# Patient Record
Sex: Female | Born: 1950 | Race: Black or African American | Hispanic: No | Smoking: Former smoker
Health system: Southern US, Community
[De-identification: ages and names within clinical notes are randomized; demographics above are authoritative.]

## PROBLEM LIST (undated history)

## (undated) DIAGNOSIS — E785 Hyperlipidemia, unspecified: Secondary | ICD-10-CM

## (undated) DIAGNOSIS — I10 Essential (primary) hypertension: Secondary | ICD-10-CM

## (undated) DIAGNOSIS — IMO0002 Reserved for concepts with insufficient information to code with codable children: Secondary | ICD-10-CM

## (undated) DIAGNOSIS — R0789 Other chest pain: Secondary | ICD-10-CM

## (undated) DIAGNOSIS — R9430 Abnormal result of cardiovascular function study, unspecified: Secondary | ICD-10-CM

## (undated) HISTORY — DX: Hyperlipidemia, unspecified: E78.5

## (undated) HISTORY — DX: Abnormal result of cardiovascular function study, unspecified: R94.30

## (undated) HISTORY — DX: Reserved for concepts with insufficient information to code with codable children: IMO0002

## (undated) HISTORY — DX: Other chest pain: R07.89

---

## 2002-01-03 ENCOUNTER — Encounter: Payer: Self-pay | Admitting: Obstetrics and Gynecology

## 2002-01-03 ENCOUNTER — Other Ambulatory Visit: Admission: RE | Admit: 2002-01-03 | Discharge: 2002-01-03 | Payer: Self-pay | Admitting: Obstetrics and Gynecology

## 2002-01-03 ENCOUNTER — Ambulatory Visit (HOSPITAL_COMMUNITY): Admission: RE | Admit: 2002-01-03 | Discharge: 2002-01-03 | Payer: Self-pay | Admitting: Obstetrics and Gynecology

## 2003-05-30 ENCOUNTER — Other Ambulatory Visit: Admission: RE | Admit: 2003-05-30 | Discharge: 2003-05-30 | Payer: Self-pay | Admitting: Obstetrics and Gynecology

## 2003-06-02 ENCOUNTER — Ambulatory Visit (HOSPITAL_COMMUNITY): Admission: RE | Admit: 2003-06-02 | Discharge: 2003-06-02 | Payer: Self-pay | Admitting: Obstetrics and Gynecology

## 2005-07-04 ENCOUNTER — Encounter: Admission: RE | Admit: 2005-07-04 | Discharge: 2005-07-04 | Payer: Self-pay | Admitting: Family Medicine

## 2012-05-05 ENCOUNTER — Other Ambulatory Visit: Payer: Self-pay

## 2012-05-05 DIAGNOSIS — Z1231 Encounter for screening mammogram for malignant neoplasm of breast: Secondary | ICD-10-CM

## 2012-06-01 ENCOUNTER — Ambulatory Visit
Admission: RE | Admit: 2012-06-01 | Discharge: 2012-06-01 | Disposition: A | Discharge status: Home / Self Care | Payer: 59 | Source: Ambulatory Visit

## 2012-06-01 DIAGNOSIS — Z1231 Encounter for screening mammogram for malignant neoplasm of breast: Secondary | ICD-10-CM

## 2012-12-03 ENCOUNTER — Emergency Department (HOSPITAL_COMMUNITY)
Admission: EM | Admit: 2012-12-03 | Discharge: 2012-12-03 | Disposition: A | Discharge status: Home / Self Care | Payer: 59 | Source: Home / Self Care | Attending: Family Medicine | Admitting: Family Medicine

## 2012-12-03 ENCOUNTER — Encounter (HOSPITAL_COMMUNITY): Payer: Self-pay | Admitting: Emergency Medicine

## 2012-12-03 DIAGNOSIS — M62830 Muscle spasm of back: Secondary | ICD-10-CM

## 2012-12-03 DIAGNOSIS — M549 Dorsalgia, unspecified: Secondary | ICD-10-CM

## 2012-12-03 DIAGNOSIS — M5380 Other specified dorsopathies, site unspecified: Secondary | ICD-10-CM

## 2012-12-03 HISTORY — DX: Essential (primary) hypertension: I10

## 2012-12-03 LAB — POCT URINALYSIS DIP (DEVICE)
Bilirubin Urine: NEGATIVE
Glucose, UA: NEGATIVE mg/dL
Hgb urine dipstick: NEGATIVE
Ketones, ur: NEGATIVE mg/dL
Leukocytes, UA: NEGATIVE
Nitrite: NEGATIVE
Protein, ur: NEGATIVE mg/dL
Specific Gravity, Urine: 1.015 (ref 1.005–1.030)
Urobilinogen, UA: 0.2 mg/dL (ref 0.0–1.0)
pH: 7 (ref 5.0–8.0)

## 2012-12-03 MED ORDER — ETODOLAC 500 MG PO TABS: 500.0000 mg | ORAL_TABLET | Freq: Two times a day (BID) | ORAL | Status: DC

## 2012-12-03 MED ORDER — TRAMADOL HCL 50 MG PO TABS: 50.0000 mg | ORAL_TABLET | Freq: Four times a day (QID) | ORAL | Status: DC | PRN

## 2012-12-03 MED ORDER — CYCLOBENZAPRINE HCL 10 MG PO TABS: 10.0000 mg | ORAL_TABLET | Freq: Two times a day (BID) | ORAL | Status: DC | PRN

## 2012-12-03 NOTE — ED Notes (Signed)
C/o lower back pain. Onset Tuesday evening after heavy lifting. States felt a little ache took otc pain meds with mild relief. Wednesday gradually started getting worse. Thursday and today experiencing severe pain with standing,sitting and walking.   Using heating  Pad and otc meds with no relief in symptoms

## 2012-12-03 NOTE — ED Provider Notes (Signed)
Physical exam is normal, no tenderness this morning there is CSN: [id number]     Arrival date & time 12/03/12  4098 History   First MD Initiated Contact with Patient 12/03/12 1015     Chief Complaint  Patient presents with  . Back Pain    onset tuesday evening after heavy lifting.    (Consider location/radiation/quality/duration/timing/severity/associated sxs/prior Treatment) HPI Comments: 62 year old female presents complaining of lower back and right hip pain. This began on Wednesday. She notes that Tuesday, she lifted a lot of heavy items. She started to feel some slight pain on Tuesday, but she started dealing with severe stabbing pain started on Wednesday. She has intermittent sharp stabs of pain that are incredibly severe. This is caused by any movement and is somewhat relieved by sitting still. The pain has been getting gradually worse. She's been taking one Advil approximately every 4 hours as needed for pain. It is not helping at all. She denies any loss of bowel or bladder control, extremity weakness, extremity numbness, perineal numbness. She has experienced something like this before in the past, many years ago.  Patient is a 62 y.o. female presenting with back pain.  Back Pain Associated symptoms: no abdominal pain, no chest pain, no dysuria, no fever and no weakness     Past Medical History  Diagnosis Date  . Hypertension   . High cholesterol    History reviewed. No pertinent past surgical history. History reviewed. No pertinent family history. History  Substance Use Topics  . Smoking status: Never Smoker   . Smokeless tobacco: Not on file  . Alcohol Use: No   OB History   Grav Para Term Preterm Abortions TAB SAB Ect Mult Living                 Review of Systems  Constitutional: Negative for fever and chills.  Eyes: Negative for visual disturbance.  Respiratory: Negative for cough and shortness of breath.   Cardiovascular: Negative for chest pain, palpitations and  leg swelling.  Gastrointestinal: Negative for nausea, vomiting and abdominal pain.  Endocrine: Negative for polydipsia and polyuria.  Genitourinary: Negative for dysuria, urgency and frequency.  Musculoskeletal: Positive for arthralgias and back pain. Negative for myalgias.  Skin: Negative for rash.  Neurological: Negative for dizziness, weakness and light-headedness.    Allergies  Review of patient's allergies indicates no known allergies.  Home Medications   Current Outpatient Rx  Name  Route  Sig  Dispense  Refill  . Ergocalciferol (VITAMIN D2 PO)   Oral   Take by mouth.         . rosuvastatin (CRESTOR) 5 MG tablet   Oral   Take 5 mg by mouth daily.         . valsartan-hydrochlorothiazide (DIOVAN-HCT) 160-25 MG per tablet   Oral   Take 1 tablet by mouth daily.         . cyclobenzaprine (FLEXERIL) 10 MG tablet   Oral   Take 1 tablet (10 mg total) by mouth 2 (two) times daily as needed for muscle spasms.   30 tablet   0   . etodolac (LODINE) 500 MG tablet   Oral   Take 1 tablet (500 mg total) by mouth 2 (two) times daily.   30 tablet   0   . traMADol (ULTRAM) 50 MG tablet   Oral   Take 1 tablet (50 mg total) by mouth every 6 (six) hours as needed for pain.   20 tablet  0    BP 152/53  Pulse 62  Temp(Src) 98 F (36.7 C) (Oral)  Resp 12  SpO2 100% Physical Exam  Nursing note and vitals reviewed. Constitutional: She is oriented to person, place, and time. Vital signs are normal. She appears well-developed and well-nourished. No distress.  HENT:  Head: Normocephalic and atraumatic.  Pulmonary/Chest: Effort normal. No respiratory distress.  Musculoskeletal:       Right hip: Normal.       Lumbar back: She exhibits pain.  Neurological: She is alert and oriented to person, place, and time. She has normal strength. No sensory deficit. Coordination normal.  Skin: Skin is warm and dry. No rash noted. She is not diaphoretic.  Psychiatric: She has a normal  mood and affect. Judgment normal.    ED Course  Procedures (including critical care time) Labs Review Labs Reviewed  POCT URINALYSIS DIP (DEVICE)   Imaging Review No results found.    MDM   1. Back muscle spasm   2. Back pain     Treating with NSAIDs, muscle relaxers, tramadol. Advised patient to expect this to be near resolution after a total of about one week. It does not occur, she will followup with her primary care for further evaluation.   Meds ordered this encounter  Medications  . etodolac (LODINE) 500 MG tablet    Sig: Take 1 tablet (500 mg total) by mouth 2 (two) times daily.    Dispense:  30 tablet    Refill:  0    Order Specific Question:  Supervising Provider    Answer:  Clementeen Graham, S [id number]  . cyclobenzaprine (FLEXERIL) 10 MG tablet    Sig: Take 1 tablet (10 mg total) by mouth 2 (two) times daily as needed for muscle spasms.    Dispense:  30 tablet    Refill:  0    Order Specific Question:  Supervising Provider    Answer:  Clementeen Graham, S [id number]  . traMADol (ULTRAM) 50 MG tablet    Sig: Take 1 tablet (50 mg total) by mouth every 6 (six) hours as needed for pain.    Dispense:  20 tablet    Refill:  0    Order Specific Question:  Supervising Provider    Answer:  Clementeen Graham, Kathie Rhodes [[id number]]       Graylon Good, PA-C 12/03/12 1035

## 2012-12-04 NOTE — ED Provider Notes (Signed)
Medical screening examination/treatment/procedure(s) were performed by a resident physician or non-physician practitioner and as the supervising physician I was immediately available for consultation/collaboration.  Jafari Mckillop, MD   Nakema Fake S Angie Hogg, MD 12/04/12 0839 

## 2013-05-09 ENCOUNTER — Ambulatory Visit (INDEPENDENT_AMBULATORY_CARE_PROVIDER_SITE_OTHER): Payer: 59 | Admitting: Cardiology

## 2013-05-09 ENCOUNTER — Encounter: Payer: Self-pay | Admitting: Cardiology

## 2013-05-09 VITALS — BP 160/86 | HR 61 | Ht 62.5 in | Wt 131.1 lb

## 2013-05-09 DIAGNOSIS — R9431 Abnormal electrocardiogram [ECG] [EKG]: Secondary | ICD-10-CM | POA: Insufficient documentation

## 2013-05-09 DIAGNOSIS — E785 Hyperlipidemia, unspecified: Secondary | ICD-10-CM | POA: Insufficient documentation

## 2013-05-09 DIAGNOSIS — I10 Essential (primary) hypertension: Secondary | ICD-10-CM

## 2013-05-09 DIAGNOSIS — R0789 Other chest pain: Secondary | ICD-10-CM | POA: Insufficient documentation

## 2013-05-09 NOTE — Patient Instructions (Signed)
Your physician has requested that you have an echocardiogram. Echocardiography is a painless test that uses sound waves to create images of your heart. It provides your doctor with information about the size and shape of your heart and how well your heart's chambers and valves are working. This procedure takes approximately one hour. There are no restrictions for this procedure.  Your physician recommends that you schedule a follow-up appointment in: we will contact you with your Echo results.

## 2013-05-09 NOTE — Assessment & Plan Note (Signed)
The EKG changes are nonspecific. She will have a followup 2-D echo to be sure that there are no wall motion abnormalities.

## 2013-05-09 NOTE — Assessment & Plan Note (Signed)
Blood pressure is mildly elevated today. It was not elevated at Dr. Orest Dikes office. I feel it is prudent not to change her medicine at this time.

## 2013-05-09 NOTE — Assessment & Plan Note (Signed)
At this point the patient is feeling better. We do not have any definite proof of an ischemic etiology. With risk factors and with her resting nonspecific EKG abnormalities, I feel we should proceed with 2-D echo. I will review this study when it is available. We will then be in touch with the patient. If the echo is normal, she would not need any further workup and she would not need a followup visit. We will let her know of the plans after we have the echo data.

## 2013-05-09 NOTE — Progress Notes (Signed)
Patient ID: Veronica Diaz, female   DOB: 1950-05-18, 63 y.o.   MRN: [id number]    HPI  Patient is seen today for cardiology consultation to assess chest tightness. I've received excellent records from Dr. Harlan Stains. The patient does not have documented coronary disease. She does have a history of hypertension and dyslipidemia. Recently she had some chest tightness. There was some component of discomfort with cough and some discomfort with movement. However, there were other components that were concerning. The patient does have risk factors for coronary disease. Her EKG is mildly abnormal. She was referred for further evaluation.  Since she was assessed by Dr. Dema Severin, she is feeling better. She's not having any exertional chest tightness. There is a history of hypertension and dyslipidemia. There is coronary disease in the family but not at a young age. She has returned to normal activities.   Allergies  Allergen Reactions  . Pravastatin Other (See Comments)    Leg Cramps     Current Outpatient Prescriptions  Medication Sig Dispense Refill  . cyclobenzaprine (FLEXERIL) 10 MG tablet Take 1 tablet (10 mg total) by mouth 2 (two) times daily as needed for muscle spasms.  30 tablet  0  . Ergocalciferol (VITAMIN D2 PO) Take by mouth.      . Esomeprazole Magnesium (NEXIUM PO) Take by mouth daily.      Marland Kitchen etodolac (LODINE) 500 MG tablet Take 1 tablet (500 mg total) by mouth 2 (two) times daily.  30 tablet  0  . LUMIGAN 0.01 % SOLN Place 1 drop into both eyes at bedtime.       . rosuvastatin (CRESTOR) 5 MG tablet Take 5 mg by mouth daily.      . traMADol (ULTRAM) 50 MG tablet Take 1 tablet (50 mg total) by mouth every 6 (six) hours as needed for pain.  20 tablet  0  . valsartan-hydrochlorothiazide (DIOVAN-HCT) 160-25 MG per tablet Take 1 tablet by mouth daily.       No current facility-administered medications for this visit.    History   Social History  . Marital Status: Legally  Separated    Spouse Name: N/A    Number of Children: N/A  . Years of Education: N/A   Occupational History  . Not on file.   Social History Main Topics  . Smoking status: Former Research scientist (life sciences)  . Smokeless tobacco: Not on file  . Alcohol Use: No  . Drug Use: No  . Sexual Activity: Yes   Other Topics Concern  . Not on file   Social History Narrative  . No narrative on file    Family History  Problem Relation Age of Onset  . Heart disease Father     Past Medical History  Diagnosis Date  . Hypertension   . Dyslipidemia   . Chest tightness     History reviewed. No pertinent past surgical history.  Patient Active Problem List   Diagnosis Date Noted  . Abnormal EKG 05/09/2013  . Hypertension   . Dyslipidemia   . Chest tightness     ROS   Patient denies fever, chills, headache, sweats, rash, change in vision, change in hearing, chest pain, cough, nausea vomiting, urinary symptoms. All other systems are reviewed and are negative.    PHYSICAL EXAM  patient is oriented to person time and place. Affect is normal. There is no jugulovenous distention. Lungs are clear. Respiratory effort is nonlabored. Cardiac exam reveals S1 and S2. There no significant murmurs. The abdomen  is soft. There is no peripheral edema. There no musculoskeletal deformities. There are no skin rashes.  Filed Vitals:   05/09/13 1114  BP: 160/86  Pulse: 61  Height: 5' 2.5" (1.588 m)  Weight: 131 lb 1.9 oz (59.476 kg)    EKG is done today and reviewed by me. There is sinus rhythm. There are diffuse nonspecific ST-T wave changes.   ASSESSMENT & PLAN

## 2013-05-25 ENCOUNTER — Ambulatory Visit (HOSPITAL_COMMUNITY): Payer: 59 | Attending: Cardiology | Admitting: Radiology

## 2013-05-25 DIAGNOSIS — R9431 Abnormal electrocardiogram [ECG] [EKG]: Secondary | ICD-10-CM

## 2013-05-25 NOTE — Progress Notes (Signed)
Echocardiogram performed.  

## 2013-05-27 ENCOUNTER — Encounter: Payer: Self-pay | Admitting: Cardiology

## 2013-05-27 DIAGNOSIS — R9430 Abnormal result of cardiovascular function study, unspecified: Secondary | ICD-10-CM | POA: Insufficient documentation

## 2013-05-27 NOTE — Progress Notes (Signed)
Patient ID: Veronica Diaz, female   DOB: 1950-06-20, 63 y.o.   MRN: [id number] I saw the patient in consultation in the office in March, 2015. The plan was to proceed with two-dimensional echo. The study has been done and the echo was normal. With this information, the plan is for no further cardiac workup. The patient will be notified of the good result later today.  Daryel November, MD

## 2014-01-19 ENCOUNTER — Other Ambulatory Visit: Payer: Self-pay

## 2014-01-19 DIAGNOSIS — Z1231 Encounter for screening mammogram for malignant neoplasm of breast: Secondary | ICD-10-CM

## 2014-02-01 ENCOUNTER — Ambulatory Visit
Admission: RE | Admit: 2014-02-01 | Discharge: 2014-02-01 | Disposition: A | Discharge status: Home / Self Care | Payer: 59 | Source: Ambulatory Visit

## 2014-02-01 DIAGNOSIS — Z1231 Encounter for screening mammogram for malignant neoplasm of breast: Secondary | ICD-10-CM

## 2014-02-16 ENCOUNTER — Other Ambulatory Visit (HOSPITAL_COMMUNITY)
Admission: RE | Admit: 2014-02-16 | Discharge: 2014-02-16 | Disposition: A | Discharge status: Home / Self Care | Payer: 59 | Source: Ambulatory Visit | Attending: Obstetrics & Gynecology | Admitting: Obstetrics & Gynecology

## 2014-02-16 ENCOUNTER — Other Ambulatory Visit: Payer: Self-pay | Admitting: Obstetrics & Gynecology

## 2014-02-16 DIAGNOSIS — Z1151 Encounter for screening for human papillomavirus (HPV): Secondary | ICD-10-CM | POA: Insufficient documentation

## 2014-02-16 DIAGNOSIS — Z01419 Encounter for gynecological examination (general) (routine) without abnormal findings: Secondary | ICD-10-CM | POA: Insufficient documentation

## 2014-02-20 LAB — CYTOLOGY - PAP

## 2015-11-11 ENCOUNTER — Emergency Department (HOSPITAL_COMMUNITY): Payer: 59

## 2015-11-11 ENCOUNTER — Emergency Department (HOSPITAL_COMMUNITY)
Admission: EM | Admit: 2015-11-11 | Discharge: 2015-11-11 | Disposition: A | Discharge status: Home / Self Care | Payer: 59 | Attending: Emergency Medicine | Admitting: Emergency Medicine

## 2015-11-11 ENCOUNTER — Encounter (HOSPITAL_COMMUNITY): Payer: Self-pay | Admitting: Emergency Medicine

## 2015-11-11 DIAGNOSIS — Y929 Unspecified place or not applicable: Secondary | ICD-10-CM | POA: Diagnosis not present

## 2015-11-11 DIAGNOSIS — I10 Essential (primary) hypertension: Secondary | ICD-10-CM | POA: Insufficient documentation

## 2015-11-11 DIAGNOSIS — Z23 Encounter for immunization: Secondary | ICD-10-CM | POA: Insufficient documentation

## 2015-11-11 DIAGNOSIS — Y999 Unspecified external cause status: Secondary | ICD-10-CM | POA: Insufficient documentation

## 2015-11-11 DIAGNOSIS — S91115A Laceration without foreign body of left lesser toe(s) without damage to nail, initial encounter: Secondary | ICD-10-CM | POA: Diagnosis not present

## 2015-11-11 DIAGNOSIS — Y9389 Activity, other specified: Secondary | ICD-10-CM | POA: Diagnosis not present

## 2015-11-11 DIAGNOSIS — Z87891 Personal history of nicotine dependence: Secondary | ICD-10-CM | POA: Insufficient documentation

## 2015-11-11 DIAGNOSIS — S91119A Laceration without foreign body of unspecified toe without damage to nail, initial encounter: Secondary | ICD-10-CM

## 2015-11-11 DIAGNOSIS — W208XXA Other cause of strike by thrown, projected or falling object, initial encounter: Secondary | ICD-10-CM | POA: Diagnosis not present

## 2015-11-11 MED ORDER — LIDOCAINE HCL (PF) 1 % IJ SOLN
10.0000 mL | Freq: Once | INTRAMUSCULAR | Status: AC
  Administered 2015-11-11: 10 mL
  Filled 2015-11-11: qty 10

## 2015-11-11 MED ORDER — ACETAMINOPHEN 500 MG PO TABS: 500.0000 mg | ORAL_TABLET | Freq: Four times a day (QID) | ORAL | 0 refills | Status: DC | PRN

## 2015-11-11 MED ORDER — LIDOCAINE-EPINEPHRINE-TETRACAINE (LET) SOLUTION
3.0000 mL | Freq: Once | NASAL | Status: AC
  Administered 2015-11-11: 3 mL via TOPICAL
  Filled 2015-11-11: qty 3

## 2015-11-11 MED ORDER — ACETAMINOPHEN 325 MG PO TABS
650.0000 mg | ORAL_TABLET | Freq: Once | ORAL | Status: AC
  Administered 2015-11-11: 650 mg via ORAL
  Filled 2015-11-11: qty 2

## 2015-11-11 MED ORDER — BACITRACIN ZINC 500 UNIT/GM EX OINT
1.0000 "application " | TOPICAL_OINTMENT | Freq: Two times a day (BID) | CUTANEOUS | Status: DC
  Administered 2015-11-11: 1 via TOPICAL

## 2015-11-11 MED ORDER — TETANUS-DIPHTH-ACELL PERTUSSIS 5-2.5-18.5 LF-MCG/0.5 IM SUSP
0.5000 mL | Freq: Once | INTRAMUSCULAR | Status: AC
  Administered 2015-11-11: 0.5 mL via INTRAMUSCULAR
  Filled 2015-11-11: qty 0.5

## 2015-11-11 MED ORDER — BACITRACIN ZINC 500 UNIT/GM EX OINT: 1.0000 "application " | TOPICAL_OINTMENT | Freq: Two times a day (BID) | CUTANEOUS | 1 refills | Status: DC

## 2015-11-11 NOTE — ED Triage Notes (Signed)
Pt presents to ED for assessment of a laceration to the third toe of the left foot.  Pt sts she dropped a hot glass baking dish that shattered and cut her toe.  Bleeding controlled.

## 2015-11-11 NOTE — ED Provider Notes (Signed)
[facility] DEPT Provider Note   CSN: [id number] Arrival date & time: 11/11/15  1857  By signing my name below, I, Soijett Blue, attest that this documentation has been prepared under the direction and in the presence of Will Eliab Closson, PA-C Electronically Signed: Soijett Blue, ED Scribe. 11/11/15. 9:11 PM.   History   Chief Complaint Chief Complaint  Patient presents with  . Extremity Laceration    HPI Veronica Diaz is a 65 y.o. female with a PMHx of HTN, who presents to the Emergency Department complaining of an extremity laceration onset 3 hours ago. Pt reports that she placed a glass baking dish on a hot stove burner and when she went to remove it, the dish shattered and cut her left third toe. Pt notes that she is unsure of the status of when her last tetanus vaccination was. Pt is having associated symptoms of left third toe pain. She states that she has tried applying a bandage without any medications for the relief of her symptoms. She denies color change, numbness, weakness, swelling, and any other symptoms.     The history is provided by the patient. No language interpreter was used.    Past Medical History:  Diagnosis Date  . Chest tightness   . Dyslipidemia   . Ejection fraction   . Hypertension     Patient Active Problem List   Diagnosis Date Noted  . Ejection fraction   . Abnormal EKG 05/09/2013  . Hypertension   . Dyslipidemia   . Chest tightness     History reviewed. No pertinent surgical history.  OB History    No data available       Home Medications    Prior to Admission medications   Medication Sig Start Date End Date Taking? Authorizing Provider  acetaminophen (TYLENOL) 500 MG tablet Take 1 tablet (500 mg total) by mouth every 6 (six) hours as needed for mild pain or moderate pain. 11/11/15   Waynetta Pean, PA-C  bacitracin ointment Apply 1 application topically 2 (two) times daily. 11/11/15   Waynetta Pean, PA-C  cyclobenzaprine  (FLEXERIL) 10 MG tablet Take 1 tablet (10 mg total) by mouth 2 (two) times daily as needed for muscle spasms. 12/03/12   Liam Graham, PA-C  Ergocalciferol (VITAMIN D2 PO) Take by mouth.    Historical Provider, MD  Esomeprazole Magnesium (NEXIUM PO) Take by mouth daily.    Historical Provider, MD  etodolac (LODINE) 500 MG tablet Take 1 tablet (500 mg total) by mouth 2 (two) times daily. 12/03/12   Freeman Caldron Baker, PA-C  LUMIGAN 0.01 % SOLN Place 1 drop into both eyes at bedtime.  04/18/13   Historical Provider, MD  rosuvastatin (CRESTOR) 5 MG tablet Take 5 mg by mouth daily.    Historical Provider, MD  traMADol (ULTRAM) 50 MG tablet Take 1 tablet (50 mg total) by mouth every 6 (six) hours as needed for pain. 12/03/12   Freeman Caldron Baker, PA-C  valsartan-hydrochlorothiazide (DIOVAN-HCT) 160-25 MG per tablet Take 1 tablet by mouth daily.    Historical Provider, MD    Family History Family History  Problem Relation Age of Onset  . Heart disease Father     Social History Social History  Substance Use Topics  . Smoking status: Former Research scientist (life sciences)  . Smokeless tobacco: Never Used  . Alcohol use No     Allergies   Pravastatin   Review of Systems Review of Systems  Constitutional: Negative for fever.  Musculoskeletal: Positive for arthralgias (  left third toe). Negative for joint swelling.  Skin: Positive for wound (laceration to left third toe). Negative for color change.  Neurological: Negative for weakness and numbness.     Physical Exam Updated Vital Signs BP 171/64   Pulse 67   Temp 98.1 F (36.7 C) (Oral)   Resp 18   SpO2 97%   Physical Exam  Constitutional: She appears well-developed and well-nourished. No distress.  HENT:  Head: Normocephalic and atraumatic.  Eyes: Right eye exhibits no discharge. Left eye exhibits no discharge.  Cardiovascular: Normal rate, regular rhythm and intact distal pulses.   Bilateral dorsalis pedis and posterior tibialis pulses intact.     Pulmonary/Chest: Effort normal. No respiratory distress.  Musculoskeletal: Normal range of motion. She exhibits no deformity.  Neurological: She is alert. Coordination normal.  Skin: Skin is warm and dry. Capillary refill takes less than 2 seconds. Laceration noted. No rash noted. She is not diaphoretic. No erythema. No pallor.  Laceration overlying the left third toe, 3 cm in length. Bleeding controlled.   Psychiatric: She has a normal mood and affect. Her behavior is normal.  Nursing note and vitals reviewed.    ED Treatments / Results  DIAGNOSTIC STUDIES: Oxygen Saturation is 100% on RA, nl by my interpretation.    COORDINATION OF CARE: 9:07 PM Discussed treatment plan with pt at bedside which includes update tetanus vaccination, left foot xray, laceration repair, and pt agreed to plan.   Radiology Dg Foot Complete Left  Result Date: 11/11/2015 CLINICAL DATA:  Acute onset of laceration to the dorsal left third toe from dropped glass pot. Initial encounter. EXAM: LEFT FOOT - COMPLETE 3+ VIEW COMPARISON:  None. FINDINGS: There is no evidence of fracture or dislocation. The joint spaces are preserved. There is no evidence of talar subluxation; the subtalar joint is unremarkable in appearance. An os peroneum is noted. A small plantar calcaneal spur is noted. The known soft tissue laceration is not well characterized on radiograph. No radiopaque foreign bodies are seen. IMPRESSION: 1. No evidence of fracture or dislocation. 2. No radiopaque foreign body seen. 3. Os peroneum noted. Electronically Signed   By: Garald Balding M.D.   On: 11/11/2015 22:08    Procedures .Marland KitchenLaceration Repair Date/Time: 11/11/2015 10:21 PM Performed by: Waynetta Pean Authorized by: Waynetta Pean   Consent:    Consent obtained:  Verbal   Consent given by:  Patient   Risks discussed:  Pain and infection Anesthesia (see MAR for exact dosages):    Anesthesia method:  Topical application and nerve block    Topical anesthetic:  LET   Block location:  Third toe   Block needle gauge:  24 G   Block anesthetic:  Lidocaine 1% w/o epi   Block technique:  Digital block.    Block injection procedure:  Anatomic landmarks identified, anatomic landmarks palpated and negative aspiration for blood   Block outcome:  Anesthesia achieved Laceration details:    Location:  Toe   Toe location:  L third toe   Length (cm):  3 Repair type:    Repair type:  Simple Pre-procedure details:    Preparation:  Patient was prepped and draped in usual sterile fashion and imaging obtained to evaluate for foreign bodies Exploration:    Hemostasis achieved with:  Direct pressure and LET   Wound exploration: wound explored through full range of motion and entire depth of wound probed and visualized     Wound extent: no foreign bodies/material noted, no muscle damage noted  and no underlying fracture noted     Contaminated: no   Treatment:    Area cleansed with:  Saline   Amount of cleaning:  Extensive   Irrigation solution:  Sterile saline   Irrigation volume:  500 ml   Irrigation method:  Pressure wash Skin repair:    Repair method:  Sutures   Suture size:  4-0   Suture material:  Prolene   Suture technique:  Simple interrupted   Number of sutures:  4 Approximation:    Approximation:  Close   Vermilion border: well-aligned   Post-procedure details:    Dressing:  Sterile dressing, adhesive bandage and antibiotic ointment   Patient tolerance of procedure:  Tolerated well, no immediate complications      (including critical care time)  Medications Ordered in ED Medications  bacitracin ointment 1 application (1 application Topical Given 11/11/15 2258)  Tdap (BOOSTRIX) injection 0.5 mL (0.5 mLs Intramuscular Given 11/11/15 2117)  lidocaine (PF) (XYLOCAINE) 1 % injection 10 mL (10 mLs Infiltration Given 11/11/15 2118)  acetaminophen (TYLENOL) tablet 650 mg (650 mg Oral Given 11/11/15 2118)    lidocaine-EPINEPHrine-tetracaine (LET) solution (3 mLs Topical Given 11/11/15 2118)     Initial Impression / Assessment and Plan / ED Course  I have reviewed the triage vital signs and the nursing notes.  Pertinent imaging results that were available during my care of the patient were reviewed by me and considered in my medical decision making (see chart for details).  Clinical Course      Patient presents to the ED s/p a glass baking dish shattering on her left third toe. 3 cm laceration to left third toe. X-ray shows no fracture or foreign body. Tetanus updated in ED. Laceration repaired by me with 4 sutures.  Discussed laceration care with pt and answered questions.  I advised dressing changes twice a day and bacitracin twice a day. I dicussed wound precautions. Pt to f-u for suture removal in 7 days and wound check sooner should there be signs of dehiscence or infection. Pt is hemodynamically stable with no complaints prior to dc.  I advised the patient to follow-up with their primary care provider this week. I advised the patient to return to the emergency department with new or worsening symptoms or new concerns. The patient verbalized understanding and agreement with plan.     Final Clinical Impressions(s) / ED Diagnoses   Final diagnoses:  Laceration of toe, initial encounter    New Prescriptions Discharge Medication List as of 11/11/2015 10:49 PM    START taking these medications   Details  acetaminophen (TYLENOL) 500 MG tablet Take 1 tablet (500 mg total) by mouth every 6 (six) hours as needed for mild pain or moderate pain., Starting Sun 11/11/2015, Print    bacitracin ointment Apply 1 application topically 2 (two) times daily., Starting Sun 11/11/2015, Print       I personally performed the services described in this documentation, which was scribed in my presence. The recorded information has been reviewed and is accurate.       Waynetta Pean, PA-C 11/11/15 [place], MD 11/12/15 [phone]

## 2015-11-11 NOTE — ED Notes (Signed)
Pt and family understood dc material and follow up info. NAD noted. Scripts given at [employer]

## 2016-01-18 ENCOUNTER — Other Ambulatory Visit: Payer: Self-pay | Admitting: Family Medicine

## 2016-01-18 DIAGNOSIS — Z1231 Encounter for screening mammogram for malignant neoplasm of breast: Secondary | ICD-10-CM

## 2016-02-14 ENCOUNTER — Ambulatory Visit
Admission: RE | Admit: 2016-02-14 | Discharge: 2016-02-14 | Disposition: A | Discharge status: Home / Self Care | Payer: 59 | Source: Ambulatory Visit | Attending: Family Medicine | Admitting: Family Medicine

## 2016-02-14 DIAGNOSIS — Z1231 Encounter for screening mammogram for malignant neoplasm of breast: Secondary | ICD-10-CM

## 2016-02-15 ENCOUNTER — Other Ambulatory Visit: Payer: Self-pay | Admitting: Family Medicine

## 2016-02-15 DIAGNOSIS — R928 Other abnormal and inconclusive findings on diagnostic imaging of breast: Secondary | ICD-10-CM

## 2016-02-21 ENCOUNTER — Ambulatory Visit
Admission: RE | Admit: 2016-02-21 | Discharge: 2016-02-21 | Disposition: A | Discharge status: Home / Self Care | Payer: 59 | Source: Ambulatory Visit | Attending: Family Medicine | Admitting: Family Medicine

## 2016-02-21 DIAGNOSIS — R928 Other abnormal and inconclusive findings on diagnostic imaging of breast: Secondary | ICD-10-CM

## 2016-02-27 ENCOUNTER — Ambulatory Visit (INDEPENDENT_AMBULATORY_CARE_PROVIDER_SITE_OTHER): Payer: 59

## 2016-02-27 ENCOUNTER — Encounter: Payer: Self-pay | Admitting: Podiatry

## 2016-02-27 ENCOUNTER — Ambulatory Visit (INDEPENDENT_AMBULATORY_CARE_PROVIDER_SITE_OTHER): Payer: 59 | Admitting: Podiatry

## 2016-02-27 VITALS — BP 111/55 | HR 82 | Resp 16 | Ht 63.5 in | Wt 132.0 lb

## 2016-02-27 DIAGNOSIS — M79675 Pain in left toe(s): Secondary | ICD-10-CM

## 2016-02-27 DIAGNOSIS — M779 Enthesopathy, unspecified: Secondary | ICD-10-CM

## 2016-02-27 DIAGNOSIS — M7752 Other enthesopathy of left foot: Secondary | ICD-10-CM | POA: Diagnosis not present

## 2016-02-27 MED ORDER — TRIAMCINOLONE ACETONIDE 10 MG/ML IJ SUSP
10.0000 mg | Freq: Once | INTRAMUSCULAR | Status: AC
  Administered 2016-02-27: 10 mg

## 2016-02-27 NOTE — Progress Notes (Signed)
   Subjective:    Patient ID: Veronica Diaz, female    DOB: 1950/12/11, 66 y.o.   MRN: [id number]  HPI  Chief Complaint  Patient presents with  . Toe Pain    Left 3rd toe; Pt states that she a glass dish expoded and a piece of glass cut her on her 3rd toe in 10/2015. She went to the ED and had stitches placed. Pt states that she still has pain in the 3rd toe and she is unable to move her toes in the left foot.        Review of Systems     Objective:   Physical Exam        Assessment & Plan:

## 2016-02-28 NOTE — Progress Notes (Signed)
Subjective:     Patient ID: Veronica Diaz, female   DOB: [birth year], 66 y.o.   MRN: [id number]  HPI patient presents stating that the third digit on her left foot has been giving her trouble and making it difficult to wear shoe gear comfortably. In September she had a incident with a glass that exploded and she did cut her left foot and went to the emergency room and had stitches put in and has had trouble since then   Review of Systems  All other systems reviewed and are negative.      Objective:   Physical Exam  Constitutional: She is oriented to person, place, and time.  Cardiovascular: Intact distal pulses.   Musculoskeletal: Normal range of motion.  Neurological: She is oriented to person, place, and time.  Skin: Skin is warm.  Nursing note and vitals reviewed.  neurovascular status intact muscle strength adequate range of motion within normal limits with patient found to have scar tissue on the dorsum of the left third digit localized with discomfort in the proximal portion of the toe nondescript in nature both medial lateral side and no discomfort in the metatarsal phalangeal joint. It does have mild discoloration very mild edema but no other pathology and the extensor flexor tendon are in place     Assessment:     Appears to be some kind of a soft tissue injury left third digit with possibility for low-grade type pain syndrome or other pathology due to the increased pain she is experiencing    Plan:     H&P condition reviewed x-ray taken. Today I did a proximal nerve block and injected a small amount of dextran some Kenalog around the proximal phalanx to try to reduce inflammation and applied padding to take pressure off the toe. If symptoms persist we may need to get more advanced testing  X-ray report was negative for signs of bone pathology appears to be soft tissue

## 2017-05-20 ENCOUNTER — Other Ambulatory Visit: Payer: Self-pay | Admitting: Family Medicine

## 2017-05-20 DIAGNOSIS — Z1231 Encounter for screening mammogram for malignant neoplasm of breast: Secondary | ICD-10-CM

## 2017-06-10 ENCOUNTER — Ambulatory Visit
Admission: RE | Admit: 2017-06-10 | Discharge: 2017-06-10 | Disposition: A | Discharge status: Home / Self Care | Payer: 59 | Source: Ambulatory Visit | Attending: Family Medicine | Admitting: Family Medicine

## 2017-06-10 DIAGNOSIS — Z1231 Encounter for screening mammogram for malignant neoplasm of breast: Secondary | ICD-10-CM

## 2019-01-03 ENCOUNTER — Other Ambulatory Visit: Payer: Self-pay | Admitting: Family Medicine

## 2019-01-03 DIAGNOSIS — Z1231 Encounter for screening mammogram for malignant neoplasm of breast: Secondary | ICD-10-CM

## 2019-01-21 ENCOUNTER — Other Ambulatory Visit: Payer: Self-pay | Admitting: Family Medicine

## 2019-01-21 ENCOUNTER — Other Ambulatory Visit (HOSPITAL_COMMUNITY)
Admission: RE | Admit: 2019-01-21 | Discharge: 2019-01-21 | Disposition: A | Discharge status: Home / Self Care | Payer: 59 | Source: Ambulatory Visit | Attending: Family Medicine | Admitting: Family Medicine

## 2019-01-21 DIAGNOSIS — Z124 Encounter for screening for malignant neoplasm of cervix: Secondary | ICD-10-CM | POA: Diagnosis not present

## 2019-01-25 ENCOUNTER — Other Ambulatory Visit: Payer: Self-pay | Admitting: Family Medicine

## 2019-01-25 DIAGNOSIS — M8580 Other specified disorders of bone density and structure, unspecified site: Secondary | ICD-10-CM

## 2019-01-25 LAB — CYTOLOGY - PAP
Comment: NEGATIVE
Diagnosis: NEGATIVE
High risk HPV: NEGATIVE

## 2019-02-25 ENCOUNTER — Ambulatory Visit
Admission: RE | Admit: 2019-02-25 | Discharge: 2019-02-25 | Disposition: A | Discharge status: Home / Self Care | Payer: 59 | Source: Ambulatory Visit | Attending: Family Medicine | Admitting: Family Medicine

## 2019-02-25 ENCOUNTER — Other Ambulatory Visit: Payer: Self-pay

## 2019-02-25 DIAGNOSIS — Z1231 Encounter for screening mammogram for malignant neoplasm of breast: Secondary | ICD-10-CM

## 2019-03-08 ENCOUNTER — Ambulatory Visit
Admission: RE | Admit: 2019-03-08 | Discharge: 2019-03-08 | Disposition: A | Discharge status: Home / Self Care | Payer: 59 | Source: Ambulatory Visit | Attending: Family Medicine | Admitting: Family Medicine

## 2019-03-08 ENCOUNTER — Other Ambulatory Visit: Payer: Self-pay

## 2019-03-08 DIAGNOSIS — M8580 Other specified disorders of bone density and structure, unspecified site: Secondary | ICD-10-CM

## 2019-03-17 ENCOUNTER — Other Ambulatory Visit: Payer: Self-pay | Admitting: Family Medicine

## 2019-03-17 DIAGNOSIS — M8588 Other specified disorders of bone density and structure, other site: Secondary | ICD-10-CM

## 2019-04-25 ENCOUNTER — Other Ambulatory Visit: Payer: 59

## 2019-07-25 ENCOUNTER — Other Ambulatory Visit: Payer: Self-pay | Admitting: Family Medicine

## 2019-07-25 DIAGNOSIS — R519 Headache, unspecified: Secondary | ICD-10-CM

## 2019-08-29 ENCOUNTER — Ambulatory Visit
Admission: RE | Admit: 2019-08-29 | Discharge: 2019-08-29 | Disposition: A | Discharge status: Home / Self Care | Payer: Medicare Other | Source: Ambulatory Visit | Attending: Family Medicine | Admitting: Family Medicine

## 2019-08-29 DIAGNOSIS — R519 Headache, unspecified: Secondary | ICD-10-CM

## 2019-08-31 ENCOUNTER — Other Ambulatory Visit (HOSPITAL_COMMUNITY): Payer: Self-pay | Admitting: Interventional Radiology

## 2019-08-31 DIAGNOSIS — I671 Cerebral aneurysm, nonruptured: Secondary | ICD-10-CM

## 2019-09-08 ENCOUNTER — Ambulatory Visit (HOSPITAL_COMMUNITY): Payer: Medicare Other

## 2019-09-09 ENCOUNTER — Other Ambulatory Visit: Payer: Self-pay

## 2019-09-09 ENCOUNTER — Ambulatory Visit (HOSPITAL_COMMUNITY)
Admission: RE | Admit: 2019-09-09 | Discharge: 2019-09-09 | Disposition: A | Discharge status: Home / Self Care | Payer: Medicare Other | Source: Ambulatory Visit | Attending: Interventional Radiology | Admitting: Interventional Radiology

## 2019-09-09 DIAGNOSIS — I671 Cerebral aneurysm, nonruptured: Secondary | ICD-10-CM

## 2019-09-09 NOTE — Consult Note (Signed)
Chief Complaint: Patient was seen in consultation today to discuss recent MRA findings/possible further work up.  Referring Physician(s): Dr. Harlan Stains (PCP)  Supervising Physician: Luanne Bras  Patient Status: [facility] - Out-pt  History of Present Illness: Veronica Diaz is a 69 y.o. female with a past medical history significant for glaucoma, HTN and HLD who presents today to discuss recent MRA findings. Veronica Diaz experienced two episodes of severe left sided head pain following sneezing in April of this year. Immediately following sneezing she describes an intense pain to the entire left side of her head and she felt like she may pass out so she immediately laid down. She did not lose consciousness or fall down. After laying down for a few seconds to minute the pain spontaneously resolved and she felt back to normal. She has not had any similar episodes since that time however she has begun sneezing with her mouth open so she is unsure if that is related. She denies any chest pain, dyspnea, n/v, vision changes, tremors or weakness during these episodes. She has high blood pressure which is mostly well controlled as she was just started on metoprolol recently by her PCP due to her BP readings being 150-160/70s. She also has a history of high cholesterol for which she takes Rosuvastatin. She denies any personal or family history of diabetes or heart problems. Her parents both had high blood pressure but no known family history of brain aneurysms. She is a previous tobacco smoker who quite over 30 years ago, she does not use illicit drugs or alcohol.  Past Medical History:  Diagnosis Date  . Chest tightness   . Dyslipidemia   . Ejection fraction   . Hypertension     No past surgical history on file.  Allergies: Pravastatin  Medications: Prior to Admission medications   Medication Sig Start Date End Date Taking? Authorizing Provider  acetaminophen (TYLENOL) 500 MG tablet  Take 1 tablet (500 mg total) by mouth every 6 (six) hours as needed for mild pain or moderate pain. 11/11/15   Waynetta Pean, PA-C  cyclobenzaprine (FLEXERIL) 10 MG tablet Take 1 tablet (10 mg total) by mouth 2 (two) times daily as needed for muscle spasms. Patient not taking: Reported on 02/27/2016 12/03/12   Liam Graham, PA-C  Ergocalciferol (VITAMIN D2 PO) Take by mouth.    [provider]  Esomeprazole Magnesium (NEXIUM PO) Take by mouth daily.    [provider]  LUMIGAN 0.01 % SOLN Place 1 drop into both eyes at bedtime.  04/18/13   [provider]  rosuvastatin (CRESTOR) 5 MG tablet Take 5 mg by mouth daily.    [provider]  traMADol (ULTRAM) 50 MG tablet Take 1 tablet (50 mg total) by mouth every 6 (six) hours as needed for pain. 12/03/12   Baker, Freeman Caldron, PA-C  valsartan-hydrochlorothiazide (DIOVAN-HCT) 160-25 MG per tablet Take 1 tablet by mouth daily.    [provider]     Family History  Problem Relation Age of Onset  . Heart disease Father     Social History   Socioeconomic History  . Marital status: Legally Separated    Spouse name: Not on file  . Number of children: Not on file  . Years of education: Not on file  . Highest education level: Not on file  Occupational History  . Not on file  Tobacco Use  . Smoking status: Former Research scientist (life sciences)  . Smokeless tobacco: Never Used  Substance and  Sexual Activity  . Alcohol use: No  . Drug use: No  . Sexual activity: Yes  Other Topics Concern  . Not on file  Social History Narrative  . Not on file   Social Determinants of Health   Financial Resource Strain:   . Difficulty of Paying Living Expenses:   Food Insecurity:   . Worried About Charity fundraiser in the Last Year:   . Arboriculturist in the Last Year:   Transportation Needs:   . Film/video editor (Medical):   Marland Kitchen Lack of Transportation (Non-Medical):   Physical Activity:   . Days of Exercise per Week:   .  Minutes of Exercise per Session:   Stress:   . Feeling of Stress :   Social Connections:   . Frequency of Communication with Friends and Family:   . Frequency of Social Gatherings with Friends and Family:   . Attends Religious Services:   . Active Member of Clubs or Organizations:   . Attends Archivist Meetings:   Marland Kitchen Marital Status:      Review of Systems: A 12 point ROS discussed and pertinent positives are indicated in the HPI above.  All other systems are negative.  Review of Systems  Constitutional: Negative for activity change, appetite change, chills and fever.  Respiratory: Negative for shortness of breath.   Cardiovascular: Negative for chest pain.  Gastrointestinal: Negative for abdominal pain, blood in stool, nausea and vomiting.  Musculoskeletal: Negative for back pain.  Neurological: Positive for light-headedness and headaches (intermittently; not severe). Negative for dizziness, tremors, seizures, syncope, facial asymmetry, speech difficulty, weakness and numbness.  Psychiatric/Behavioral: Negative for confusion.    Vital Signs: There were no vitals taken for this visit.  Physical Exam Constitutional:      General: She is not in acute distress.    Appearance: She is not ill-appearing.     Comments: Pleasant, good historian.   Pulmonary:     Effort: Pulmonary effort is normal.  Skin:    General: Skin is warm and dry.  Neurological:     Mental Status: She is alert and oriented to person, place, and time.     Gait: Gait normal.  Psychiatric:        Mood and Affect: Mood normal.        Behavior: Behavior normal.        Thought Content: Thought content normal.        Judgment: Judgment normal.       Imaging: MR ANGIO HEAD WO CONTRAST  Result Date: 08/29/2019 CLINICAL DATA:  Intense headaches with sneezing. Left-sided head pain since June, increasing dizziness. EXAM: MRA HEAD WITHOUT CONTRAST TECHNIQUE: Angiographic images of the Circle of Willis  were obtained using MRA technique without intravenous contrast. COMPARISON:  None. FINDINGS: Anterior circulation: 2 mm focal outpouching involving the proximal cavernous segment of the right ICA (2:43). No significant stenosis, proximal occlusion, or vascular malformation. Posterior circulation: Dominant left vertebral artery. No significant stenosis, proximal occlusion, aneurysm, or vascular malformation. Venous sinuses: No evidence of thrombosis. Anatomic variants: Bilateral PCOM hypoplasia. There is looping of the right AICA within the right internal auditory canal. IMPRESSION: Looping of the right AICA within the right IAC. 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 Electronically Signed   By: Primitivo Gauze M.D.   On: 08/29/2019 10:49    Labs:  CBC: No results for input(s): WBC, HGB, HCT,  PLT in the last 8760 hours.  COAGS: No results for input(s): INR, APTT in the last 8760 hours.  BMP: No results for input(s): NA, K, CL, CO2, GLUCOSE, BUN, CALCIUM, CREATININE, GFRNONAA, GFRAA in the last 8760 hours.  Invalid input(s): CMP  LIVER FUNCTION TESTS: No results for input(s): BILITOT, AST, ALT, ALKPHOS, PROT, ALBUMIN in the last 8760 hours.  TUMOR MARKERS: No results for input(s): AFPTM, CEA, CA199, CHROMGRNA in the last 8760 hours.  Assessment and Plan:  69 y/o F who underwent MRA on 08/29/19 due to episodes x 2 of intense left sided head pain with pre-syncopal symptoms after sneezing in April of this year. Per patient no further similar episodes or TIA/CVA symptoms reported. MRA findings showed looping of right AICA within the right IAC and a 2 mm focal outpouching involving the right ICA proximal cavernous segment which may reflect a small aneurysm vs infundibulum.   Dr. 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 However there is some concern regarding the associated narrowing possibly related to plaque formation 2/2 HLD/HTN/previous tobacco use (although remote). Again, it is unlikely that this narrowing is associated with her above symptoms and is likely an additional incidental finding however given the appearance and medical history it is recommended that this be worked up further.  Patient was offered to proceed with conservative monitoring including repeat MRA in 6 months and continued close monitoring and control of HLD/HTN with intention to become more aggressive should TIA/CVA s/s develop or area becomes worsened by imaging criteria OR to proceed with a diagnostic cerebral angiogram. It was recommended to proceed with diagnostic angiogram in this case so that we may completely visualize the above aneurysm and associated narrowing to better formulate further recommendations for management/treatment.   All questions answered to patient's satisfaction and she has decided to proceed with a diagnostic cerebral angiogram. She would like to proceed after 8/1 as she will be out of town. IR schedulers will call her with appointment date/time and details regarding pre-procedure instructions.  Plan: 1. Schedule for diagnostic cerebral angiogram with Dr. Estanislado Pandy [facility] scheduler will call patient with appt date/time) 2. Continue all home medications to maintain adequate BP and HLD control 3. Maintain all follow ups with other specialists 4. Call NIR with questions or concerns prior to procedure 5. Present to the nearest ER for evaluation of stroke-like symptoms including but not limited to facial drooping, weakness, difficulty speaking, vision changes, mental status changes, syncope or persistent new dizziness.   Thank you for this interesting consult.  I greatly  enjoyed meeting Darren Nodal Ribas and look forward to participating in their care.  A copy of this report was sent to the requesting provider on this date.  Electronically Signed: Joaquim Nam, PA-C 09/09/2019, 1:45 PM   I spent a total of 40 Minutes in face to face in clinical consultation, greater than 50% of which was counseling/coordinating care for MRA findings review/schedule for diagnostic cerebral angiogram.

## 2019-09-14 ENCOUNTER — Other Ambulatory Visit (HOSPITAL_COMMUNITY): Payer: Self-pay | Admitting: Interventional Radiology

## 2019-09-14 ENCOUNTER — Telehealth (HOSPITAL_COMMUNITY): Payer: Self-pay | Admitting: Radiology

## 2019-09-14 DIAGNOSIS — I671 Cerebral aneurysm, nonruptured: Secondary | ICD-10-CM

## 2019-09-14 NOTE — Telephone Encounter (Signed)
Called pt to schedule cerebral angiogram with Deveshwar for her brain aneurysm. Pt will call back the week of 09/26/19 after her vacation to schedule. JM

## 2019-10-25 ENCOUNTER — Telehealth (HOSPITAL_COMMUNITY): Payer: Self-pay

## 2019-10-25 NOTE — Telephone Encounter (Signed)
Returned pt's call to schedule angiogram. Gave her available dates. She will check with her ride and call me back to schedule later this week. AW

## 2019-11-03 ENCOUNTER — Other Ambulatory Visit: Payer: Self-pay | Admitting: Radiology

## 2019-11-04 ENCOUNTER — Other Ambulatory Visit: Payer: Self-pay

## 2019-11-04 ENCOUNTER — Other Ambulatory Visit (HOSPITAL_COMMUNITY): Payer: Self-pay | Admitting: Interventional Radiology

## 2019-11-04 ENCOUNTER — Ambulatory Visit (HOSPITAL_COMMUNITY)
Admission: RE | Admit: 2019-11-04 | Discharge: 2019-11-04 | Disposition: A | Discharge status: Home / Self Care | Payer: Medicare Other | Source: Ambulatory Visit | Attending: Interventional Radiology | Admitting: Interventional Radiology

## 2019-11-04 ENCOUNTER — Other Ambulatory Visit: Payer: Self-pay | Admitting: Student

## 2019-11-04 ENCOUNTER — Encounter (HOSPITAL_COMMUNITY): Payer: Self-pay

## 2019-11-04 DIAGNOSIS — E785 Hyperlipidemia, unspecified: Secondary | ICD-10-CM | POA: Insufficient documentation

## 2019-11-04 DIAGNOSIS — Z87891 Personal history of nicotine dependence: Secondary | ICD-10-CM | POA: Insufficient documentation

## 2019-11-04 DIAGNOSIS — I10 Essential (primary) hypertension: Secondary | ICD-10-CM | POA: Insufficient documentation

## 2019-11-04 DIAGNOSIS — I671 Cerebral aneurysm, nonruptured: Secondary | ICD-10-CM | POA: Diagnosis not present

## 2019-11-04 DIAGNOSIS — Z79899 Other long term (current) drug therapy: Secondary | ICD-10-CM | POA: Diagnosis not present

## 2019-11-04 HISTORY — PX: IR ANGIO VERTEBRAL SEL VERTEBRAL BILAT MOD SED: IMG5369

## 2019-11-04 HISTORY — PX: IR ANGIO INTRA EXTRACRAN SEL COM CAROTID INNOMINATE BILAT MOD SED: IMG5360

## 2019-11-04 LAB — CBC
HCT: 38.5 % (ref 36.0–46.0)
Hemoglobin: 12.1 g/dL (ref 12.0–15.0)
MCH: 31.8 pg (ref 26.0–34.0)
MCHC: 31.4 g/dL (ref 30.0–36.0)
MCV: 101 fL — ABNORMAL HIGH (ref 80.0–100.0)
Platelets: 295 10*3/uL (ref 150–400)
RBC: 3.81 MIL/uL — ABNORMAL LOW (ref 3.87–5.11)
RDW: 12.1 % (ref 11.5–15.5)
WBC: 7 10*3/uL (ref 4.0–10.5)
nRBC: 0 % (ref 0.0–0.2)

## 2019-11-04 LAB — BASIC METABOLIC PANEL
Anion gap: 5 (ref 5–15)
BUN: 18 mg/dL (ref 8–23)
CO2: 28 mmol/L (ref 22–32)
Calcium: 9.4 mg/dL (ref 8.9–10.3)
Chloride: 102 mmol/L (ref 98–111)
Creatinine, Ser: 0.88 mg/dL (ref 0.44–1.00)
GFR calc Af Amer: >60 mL/min (ref >60)
GFR calc non Af Amer: >60 mL/min (ref >60)
Glucose, Bld: 89 mg/dL (ref 70–99)
Potassium: 3.8 mmol/L (ref 3.5–5.1)
Sodium: 135 mmol/L (ref 135–145)

## 2019-11-04 LAB — PROTIME-INR
INR: 1.2 (ref 0.8–1.2)
Prothrombin Time: 14.4 seconds (ref 11.4–15.2)

## 2019-11-04 MED ORDER — MIDAZOLAM HCL 2 MG/2ML IJ SOLN
INTRAMUSCULAR | Status: AC | PRN
  Administered 2019-11-04: 1 mg via INTRAVENOUS

## 2019-11-04 MED ORDER — HYDRALAZINE HCL 20 MG/ML IJ SOLN
INTRAMUSCULAR | Status: AC
  Filled 2019-11-04: qty 1

## 2019-11-04 MED ORDER — HEPARIN SODIUM (PORCINE) 1000 UNIT/ML IJ SOLN
INTRAMUSCULAR | Status: AC | PRN
  Administered 2019-11-04: 1000 [IU] via INTRAVENOUS

## 2019-11-04 MED ORDER — HEPARIN SODIUM (PORCINE) 1000 UNIT/ML IJ SOLN
INTRAMUSCULAR | Status: AC
  Filled 2019-11-04: qty 1

## 2019-11-04 MED ORDER — IOHEXOL 300 MG/ML  SOLN: 150.0000 mL | Freq: Once | INTRAMUSCULAR | Status: DC | PRN

## 2019-11-04 MED ORDER — MIDAZOLAM HCL 2 MG/2ML IJ SOLN
INTRAMUSCULAR | Status: AC
  Filled 2019-11-04: qty 2

## 2019-11-04 MED ORDER — FENTANYL CITRATE (PF) 100 MCG/2ML IJ SOLN
INTRAMUSCULAR | Status: AC | PRN
  Administered 2019-11-04: 25 ug via INTRAVENOUS

## 2019-11-04 MED ORDER — ACETAMINOPHEN 325 MG PO TABS
650.0000 mg | ORAL_TABLET | Freq: Once | ORAL | Status: AC
  Administered 2019-11-04: 650 mg via ORAL

## 2019-11-04 MED ORDER — SODIUM CHLORIDE 0.9 % IV SOLN: Freq: Once | INTRAVENOUS | Status: DC

## 2019-11-04 MED ORDER — LIDOCAINE HCL 1 % IJ SOLN
INTRAMUSCULAR | Status: AC
  Filled 2019-11-04: qty 20

## 2019-11-04 MED ORDER — SODIUM CHLORIDE 0.9 % IV SOLN: INTRAVENOUS | Status: AC

## 2019-11-04 MED ORDER — ACETAMINOPHEN 325 MG PO TABS
ORAL_TABLET | ORAL | Status: AC
  Filled 2019-11-04: qty 2

## 2019-11-04 MED ORDER — LIDOCAINE HCL (PF) 1 % IJ SOLN
INTRAMUSCULAR | Status: AC | PRN
  Administered 2019-11-04: 10 mL

## 2019-11-04 MED ORDER — FENTANYL CITRATE (PF) 100 MCG/2ML IJ SOLN
INTRAMUSCULAR | Status: AC
  Filled 2019-11-04: qty 2

## 2019-11-04 MED ORDER — IOHEXOL 300 MG/ML  SOLN: 50.0000 mL | Freq: Once | INTRAMUSCULAR | Status: DC | PRN

## 2019-11-04 MED ORDER — HYDRALAZINE HCL 20 MG/ML IJ SOLN
INTRAMUSCULAR | Status: AC | PRN
  Administered 2019-11-04: 5 mg via INTRAVENOUS

## 2019-11-04 NOTE — Sedation Documentation (Signed)
Attempted to call report to short stay. RN not available at this time.

## 2019-11-04 NOTE — Discharge Instructions (Addendum)
Cerebral Angiogram, Care After This sheet gives you information about how to care for yourself after your procedure. Your health care provider may also give you more specific instructions. If you have problems or questions, contact your health care provider. What can I expect after the procedure? After the procedure, it is common to have:  Bruising and tenderness at the catheter insertion site.  A mild headache. Follow these instructions at home: Insertion site care  Follow instructions from your health care provider about how to take care of the insertion site. Make sure you: ? Wash your hands with soap and water before and after you change your bandage (dressing). If soap and water are not available, use hand sanitizer. ? Change your dressing as told by your health care provider.  Do not take baths, swim, or use a hot tub until your health care provider approves. You may shower 24-48 hours after the procedure, or as told by your health care provider.  To clean your insertion site: ? Gently wash the site with plain soap and water. ? Pat the area dry with a clean towel. ? Do not rub the site. This may cause bleeding.  Do not apply powder or lotion to the site. Keep the site clean and dry. Infection signs Check your incision area every day for signs of infection. Check for:  Redness, swelling, or pain.  Fluid or blood.  Warmth.  Pus or a bad smell.  Activity  Do not drive for 24 hours if you were given a sedative during your procedure.  Rest as told by your health care provider.  Do not lift anything that is heavier than 10 lb (4.5 kg), or the limit that you are told, until your health care provider says that it is safe.  Return to your normal activities as told by your health care provider, usually in about a week. Ask your health care provider what activities are safe for you. General instructions   If your insertion site starts to bleed, lie flat and put pressure  on the site. If the bleeding does not stop, get help right away. This is a medical emergency.  Do not use any products that contain nicotine or tobacco, such as cigarettes, e-cigarettes, and chewing tobacco. If you need help quitting, ask your health care provider.  Take over-the-counter and prescription medicines only as told by your health care provider.  Drink enough fluid to keep your urine pale yellow. This helps flush the contrast dye from your body.  Keep all follow-up visits as directed by your health care provider. This is important. Contact a health care provider if:  You have a fever or chills.  You have redness, swelling, or pain around your insertion site.  You have fluid or blood coming from your insertion site.  The insertion site feels warm to the touch.  You have pus or a bad smell coming from your insertion site.  You notice blood collecting in the tissue around the insertion site (hematoma). The hematoma may be painful to the touch. Get help right away if:  You have chest pain or trouble breathing.  You have severe pain or swelling at the insertion site.  The insertion area bleeds, and bleeding continues after 30 minutes of holding steady pressure on the site.  The arm or leg where the catheter was inserted is pale, cold, numb, tingling, or weak.  You have a rash.  You have any symptoms of a stroke. "BE FAST" is an  easy way to remember the main warning signs of a stroke: ? B - Balance. Signs are dizziness, sudden trouble walking, or loss of balance. ? E - Eyes. Signs are trouble seeing or a sudden change in vision. ? F - Face. Signs are sudden weakness or numbness of the face, or the face or eyelid drooping on one side. ? A - Arms. Signs are weakness or numbness in an arm. This happens suddenly and usually on one side of the body. ? S - Speech. Signs are sudden trouble speaking, slurred speech, or trouble understanding what people say. ? T - Time. Time to  call emergency services. Write down what time symptoms started.  You have other signs of a stroke, such as: ? A sudden, severe headache with no known cause. ? Nausea or vomiting. ? Seizure. These symptoms may represent a serious problem that is an emergency. Do not wait to see if the symptoms will go away. Get medical help right away. Call your local emergency services (911 in the [place] Do not drive yourself to the hospital. Summary  Bruising and tenderness at the insertion site are common.  Follow your health care provider's instructions about caring for your insertion site. Change dressing and clean the area as instructed.  If your insertion site bleeds, apply direct pressure until bleeding stops.  Return to your normal activities as told by your health care provider. Ask what activities are safe.  Rest and drink plenty of fluids. This information is not intended to replace advice given to you by your health care provider. Make sure you discuss any questions you have with your health care provider. Document Revised: 08/24/2018 Document Reviewed: 08/24/2018 Elsevier Patient Education  [place]. Moderate Conscious Sedation, Adult Sedation is the use of medicines to promote relaxation and relieve discomfort and anxiety. Moderate conscious sedation is a type of sedation. Under moderate conscious sedation, you are less alert than normal, but you are still able to respond to instructions, touch, or both. Moderate conscious sedation is used during short medical and dental procedures. It is milder than deep sedation, which is a type of sedation under which you cannot be easily woken up. It is also milder than general anesthesia, which is the use of medicines to make you unconscious. Moderate conscious sedation allows you to return to your regular activities sooner. Tell a health care provider about:  Any allergies you have.  All medicines you are taking, including vitamins, herbs,  eye drops, creams, and over-the-counter medicines.  Use of steroids (by mouth or creams).  Any problems you or family members have had with sedatives and anesthetic medicines.  Any blood disorders you have.  Any surgeries you have had.  Any medical conditions you have, such as sleep apnea.  Whether you are pregnant or may be pregnant.  Any use of cigarettes, alcohol, marijuana, or street drugs. What are the risks? Generally, this is a safe procedure. However, problems may occur, including:  Getting too much medicine (oversedation).  Nausea.  Allergic reaction to medicines.  Trouble breathing. If this happens, a breathing tube may be used to help with breathing. It will be removed when you are awake and breathing on your own.  Heart trouble.  Lung trouble. What happens before the procedure? Staying hydrated Follow instructions from your health care provider about hydration, which may include:  Up to 2 hours before the procedure - you may continue to drink clear liquids, such as water, clear fruit juice, black coffee,  and plain tea. Eating and drinking restrictions Follow instructions from your health care provider about eating and drinking, which may include:  8 hours before the procedure - stop eating heavy meals or foods such as meat, fried foods, or fatty foods.  6 hours before the procedure - stop eating light meals or foods, such as toast or cereal.  6 hours before the procedure - stop drinking milk or drinks that contain milk.  2 hours before the procedure - stop drinking clear liquids. Medicine Ask your health care provider about:  Changing or stopping your regular medicines. This is especially important if you are taking diabetes medicines or blood thinners.  Taking medicines such as aspirin and ibuprofen. These medicines can thin your blood. Do not take these medicines before your procedure if your health care provider instructs you not to.  Tests and  exams  You will have a physical exam.  You may have blood tests done to show: ? How well your kidneys and liver are working. ? How well your blood can clot. General instructions  Plan to have someone take you home from the hospital or clinic.  If you will be going home right after the procedure, plan to have someone with you for 24 hours. What happens during the procedure?  An IV tube will be inserted into one of your veins.  Medicine to help you relax (sedative) will be given through the IV tube.  The medical or dental procedure will be performed. What happens after the procedure?  Your blood pressure, heart rate, breathing rate, and blood oxygen level will be monitored often until the medicines you were given have worn off.  Do not drive for 24 hours. This information is not intended to replace advice given to you by your health care provider. Make sure you discuss any questions you have with your health care provider. Document Revised: 01/16/2017 Document Reviewed: 05/26/2015 Elsevier Patient Education  2020 [employer].

## 2019-11-04 NOTE — H&P (Signed)
Chief Complaint: Patient was seen in consultation today for diagnostic cerebral angiogram  Referring Physician(s): Dr. Harlan Stains  Supervising Physician: Luanne Bras  Patient Status: [facility] - Out-pt  History of Present Illness: Veronica Diaz is a 69 y.o. female with a medical history significant for glaucoma and HTN. In April 2021 she experienced two episodes of severe left-sided head pain after sneezing. During these episodes she felt like passing out but she never lost consciousness. The pain spontaneously resolved after laying down for a few seconds. Her PCP ordered imaging for further work up.  MR Angio Head 08/29/19: IMPRESSION: 1. Looping of the right AICA within the right IAC. 2. 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   The patient met with Dr. Estanislado Pandy 09/09/19 to discuss these findings, further diagnostic work up, and possible treatment options. The patient was offered the choice of conservative monitoring including repeat MRA in 6 months and close monitoring. She was also offered the choice to proceed with a diagnostic cerebral angiogram and this was the option the patient preferred.  Past Medical History:  Diagnosis Date  . Chest tightness   . Dyslipidemia   . Ejection fraction   . Hypertension     History reviewed. No pertinent surgical history.  Allergies: Pravastatin  Medications: Prior to Admission medications   Medication Sig Start Date End Date Taking? Authorizing Provider  amLODipine (NORVASC) 10 MG tablet Take 10 mg by mouth daily.   Yes [provider]  Cholecalciferol (DIALYVITE VITAMIN D 5000) 125 MCG (5000 UT) capsule Take 5,000 Units by mouth daily.   Yes [provider]  ibuprofen (ADVIL) 200 MG tablet Take 200 mg by mouth every 6 (six) hours as needed for headache or moderate pain.   Yes [provider]  LUMIGAN 0.01 % SOLN  Place 1 drop into both eyes at bedtime.  04/18/13  Yes [provider]  metoprolol succinate (TOPROL-XL) 25 MG 24 hr tablet Take 12.5 mg by mouth daily. 08/12/19  Yes [provider]  rosuvastatin (CRESTOR) 10 MG tablet Take 10 mg by mouth daily.   Yes [provider]  valsartan-hydrochlorothiazide (DIOVAN-HCT) 320-25 MG tablet Take 1 tablet by mouth daily.   Yes [provider]     Family History  Problem Relation Age of Onset  . Heart disease Father     Social History   Socioeconomic History  . Marital status: Legally Separated    Spouse name: Not on file  . Number of children: Not on file  . Years of education: Not on file  . Highest education level: Not on file  Occupational History  . Not on file  Tobacco Use  . Smoking status: Former Research scientist (life sciences)  . Smokeless tobacco: Never Used  Vaping Use  . Vaping Use: Never used  Substance and Sexual Activity  . Alcohol use: No  . Drug use: No  . Sexual activity: Yes  Other Topics Concern  . Not on file  Social History Narrative  . Not on file   Social Determinants of Health   Financial Resource Strain:   . Difficulty of Paying Living Expenses: Not on file  Food Insecurity:   . Worried About Charity fundraiser in the Last Year: Not on file  . Ran Out of Food in the Last Year: Not on file  Transportation Needs:   . Lack of Transportation (Medical): Not on file  . Lack of  Transportation (Non-Medical): Not on file  Physical Activity:   . Days of Exercise per Week: Not on file  . Minutes of Exercise per Session: Not on file  Stress:   . Feeling of Stress : Not on file  Social Connections:   . Frequency of Communication with Friends and Family: Not on file  . Frequency of Social Gatherings with Friends and Family: Not on file  . Attends Religious Services: Not on file  . Active Member of Clubs or Organizations: Not on file  . Attends Archivist Meetings: Not on file  . Marital Status:  Not on file    Review of Systems: A 12 point ROS discussed and pertinent positives are indicated in the HPI above.  All other systems are negative.  Review of Systems  Constitutional: Negative for appetite change and fatigue.  Respiratory: Negative for cough and shortness of breath.   Cardiovascular: Negative for chest pain and leg swelling.  Gastrointestinal: Negative for abdominal pain, diarrhea, nausea and vomiting.  Musculoskeletal: Negative for back pain.  Neurological: Negative for headaches.    Vital Signs: BP (!) 176/64   Pulse (!) 56   Temp 98.2 F (36.8 C) (Oral)   Resp 16   Ht '5\' 3"'  (1.6 m)   Wt 129 lb (58.5 kg)   SpO2 100%   BMI 22.85 kg/m   Physical Exam Constitutional:      General: She is not in acute distress. HENT:     Mouth/Throat:     Mouth: Mucous membranes are moist.     Pharynx: Oropharynx is clear.  Cardiovascular:     Rate and Rhythm: Normal rate and regular rhythm.     Pulses: Normal pulses.     Heart sounds: Normal heart sounds.  Pulmonary:     Effort: Pulmonary effort is normal.     Breath sounds: Normal breath sounds.  Abdominal:     General: Bowel sounds are normal.     Palpations: Abdomen is soft.  Musculoskeletal:        General: Normal range of motion.     Cervical back: Normal range of motion.  Skin:    General: Skin is warm and dry.  Neurological:     General: No focal deficit present.     Mental Status: She is alert and oriented to person, place, and time.     Sensory: No sensory deficit.     Coordination: Coordination normal.  Psychiatric:        Mood and Affect: Mood normal.        Behavior: Behavior normal.        Judgment: Judgment normal.     Imaging: No results found.  Labs:  CBC: Recent Labs    11/04/19 0650  WBC 7.0  HGB 12.1  HCT 38.5  PLT 295    COAGS: No results for input(s): INR, APTT in the last 8760 hours.  BMP: Recent Labs    11/04/19 0650  NA 135  K 3.8  CL 102  CO2 28  GLUCOSE 89    BUN 18  CALCIUM 9.4  CREATININE 0.88  GFRNONAA >60  GFRAA >60    LIVER FUNCTION TESTS: No results for input(s): BILITOT, AST, ALT, ALKPHOS, PROT, ALBUMIN in the last 8760 hours.  TUMOR MARKERS: No results for input(s): AFPTM, CEA, CA199, CHROMGRNA in the last 8760 hours.  Assessment and Plan:  Diagnostic cerebral angiogram to further evaluate MRA findings: Gaylyn Lambert, 69 year old female, presents today to the [facility] department for a diagnostic cerebral angiogram. MRA findings showed looping of right AICA within the right IAC and a 2 mm focal outpouching involving the right ICA proximal cavernous segment which may reflect a small aneurysm vs infundibulum.   Risks and benefits of of a diagnostic cerebral angiogram were discussed with the patient including, but not limited to bleeding, infection, vascular injury or contrast induced renal failure.  This interventional procedure involves the use of X-rays and because of the nature of the planned procedure, it is possible that we will have prolonged use of X-ray fluoroscopy.  Potential radiation risks to you include (but are not limited to) the following:  - A slightly elevated risk for cancer  several years later in life. This  risk is typically less than 0.5% percent. This risk is low in  comparison to the normal  incidence of human cancer, which is 33% for women and 50% for men according to the  [place].  - Radiation induced injury can include skin redness, resembling a rash, tissue breakdown / ulcers and hair loss (which can be temporary or permanent).   The likelihood of either of these occurring depends on the difficulty of the procedure and whether you are sensitive to radiation due to previous procedures, disease, or genetic conditions.   IF your procedure requires a prolonged use of radiation, you will be notified and given written instructions for further action.  It is  your responsibility to monitor the irradiated area for the 2 weeks following the procedure and to notify your physician if you are concerned that you have suffered a radiation induced injury.    All of the patient's questions were answered, patient is agreeable to proceed.  The patient has been NPO. Labs and vitals have been reviewed. She is not on any blood-thinning medications.   Consent signed and in chart.  Thank you for this interesting consult.  I greatly enjoyed meeting Ashe Graybeal Hepburn and look forward to participating in their care.  A copy of this report was sent to the requesting provider on this date.  Electronically Signed: Soyla Dryer, AGACNP-BC [phone] 11/04/2019, 8:19 AM   I spent a total of  30 Minutes   in face to face in clinical consultation, greater than 50% of which was counseling/coordinating care for diagnostic cerebral angiogram.

## 2019-11-04 NOTE — Procedures (Signed)
S/P 4 vessel cerebral arteriogram Rt CFA approach . Findings. 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 2.Approx 82mm RT ICA PCOM reg infundibulum. 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  S.Lelia Jons MD

## 2019-11-04 NOTE — Progress Notes (Signed)
Up and walked and tolerated well; right groin stable, no bleeding or hematoma 

## 2020-02-01 ENCOUNTER — Ambulatory Visit: Payer: Self-pay | Admitting: Podiatry

## 2020-02-02 ENCOUNTER — Other Ambulatory Visit: Payer: Self-pay

## 2020-02-02 ENCOUNTER — Ambulatory Visit: Payer: Medicare Other | Admitting: Podiatry

## 2020-02-02 ENCOUNTER — Encounter: Payer: Self-pay | Admitting: Podiatry

## 2020-02-02 DIAGNOSIS — M7751 Other enthesopathy of right foot: Secondary | ICD-10-CM

## 2020-02-02 DIAGNOSIS — M779 Enthesopathy, unspecified: Secondary | ICD-10-CM | POA: Diagnosis not present

## 2020-02-02 DIAGNOSIS — L84 Corns and callosities: Secondary | ICD-10-CM

## 2020-02-02 DIAGNOSIS — M21621 Bunionette of right foot: Secondary | ICD-10-CM | POA: Diagnosis not present

## 2020-02-02 MED ORDER — TRIAMCINOLONE ACETONIDE 10 MG/ML IJ SUSP
10.0000 mg | Freq: Once | INTRAMUSCULAR | Status: AC
  Administered 2020-02-02: 10 mg

## 2020-02-03 NOTE — Progress Notes (Signed)
Subjective:   Patient ID: Veronica Diaz, female   DOB: 69 y.o.   MRN: [id number]   HPI Patient presents with a lot of pain in the outside of the right foot and states it is been present for a number of months is gotten worse.  Also has some in the left not to the same degree and states that she has trouble bearing weight down on the foot and does not smoke likes to be active   Review of Systems  All other systems reviewed and are negative.       Objective:  Physical Exam Vitals and nursing note reviewed.  Constitutional:      Appearance: She is well-developed and well-nourished.  Cardiovascular:     Pulses: Intact distal pulses.  Pulmonary:     Effort: Pulmonary effort is normal.  Musculoskeletal:        General: Normal range of motion.  Skin:    General: Skin is warm.  Neurological:     Mental Status: She is alert.     Neurovascular status intact muscle strength found to be adequate range of motion adequate.  Patient is found to have inflammation fluid around the fifth MPJ right that is very painful when pressed with keratotic lesion that is sore and causes her to have trouble walking and bearing full weight.  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  Patient will be seen back to recheck depending on response will dictate what we need to do  X-rays were negative for signs of fracture around this area with enlargement of the head of the fifth metatarsal

## 2020-02-25 ENCOUNTER — Other Ambulatory Visit: Payer: Medicare Other

## 2020-02-25 ENCOUNTER — Other Ambulatory Visit: Payer: Self-pay

## 2020-02-25 DIAGNOSIS — Z20822 Contact with and (suspected) exposure to covid-19: Secondary | ICD-10-CM | POA: Diagnosis not present

## 2020-02-28 LAB — NOVEL CORONAVIRUS, NAA: SARS-CoV-2, NAA: NOT DETECTED

## 2020-03-30 DIAGNOSIS — Z79899 Other long term (current) drug therapy: Secondary | ICD-10-CM | POA: Diagnosis not present

## 2020-03-30 DIAGNOSIS — E785 Hyperlipidemia, unspecified: Secondary | ICD-10-CM | POA: Diagnosis not present

## 2020-04-18 ENCOUNTER — Other Ambulatory Visit: Payer: Self-pay | Admitting: Family Medicine

## 2020-04-18 DIAGNOSIS — Z1231 Encounter for screening mammogram for malignant neoplasm of breast: Secondary | ICD-10-CM

## 2020-04-20 ENCOUNTER — Ambulatory Visit
Admission: RE | Admit: 2020-04-20 | Discharge: 2020-04-20 | Disposition: A | Discharge status: Home / Self Care | Payer: Medicare Other | Source: Ambulatory Visit | Attending: Family Medicine | Admitting: Family Medicine

## 2020-04-20 ENCOUNTER — Other Ambulatory Visit: Payer: Self-pay

## 2020-04-20 DIAGNOSIS — Z1231 Encounter for screening mammogram for malignant neoplasm of breast: Secondary | ICD-10-CM

## 2020-05-08 ENCOUNTER — Telehealth (HOSPITAL_COMMUNITY): Payer: Self-pay

## 2020-05-08 NOTE — Telephone Encounter (Signed)
Called to schedule cta head/neck, no answer, left vm. AW 

## 2020-05-09 ENCOUNTER — Other Ambulatory Visit (HOSPITAL_COMMUNITY): Payer: Self-pay | Admitting: Interventional Radiology

## 2020-05-09 DIAGNOSIS — I671 Cerebral aneurysm, nonruptured: Secondary | ICD-10-CM

## 2020-05-18 ENCOUNTER — Ambulatory Visit (HOSPITAL_COMMUNITY)
Admission: RE | Admit: 2020-05-18 | Discharge: 2020-05-18 | Disposition: A | Discharge status: Home / Self Care | Payer: Medicare Other | Source: Ambulatory Visit | Attending: Interventional Radiology | Admitting: Interventional Radiology

## 2020-05-18 ENCOUNTER — Encounter (HOSPITAL_COMMUNITY): Payer: Self-pay

## 2020-05-18 ENCOUNTER — Other Ambulatory Visit: Payer: Self-pay

## 2020-05-18 DIAGNOSIS — I671 Cerebral aneurysm, nonruptured: Secondary | ICD-10-CM

## 2020-05-18 MED ORDER — IOHEXOL 350 MG/ML SOLN: 75.0000 mL | Freq: Once | INTRAVENOUS | Status: DC | PRN

## 2020-05-25 ENCOUNTER — Other Ambulatory Visit: Payer: Self-pay

## 2020-05-25 ENCOUNTER — Ambulatory Visit (HOSPITAL_COMMUNITY)
Admission: RE | Admit: 2020-05-25 | Discharge: 2020-05-25 | Disposition: A | Discharge status: Home / Self Care | Payer: Medicare Other | Source: Ambulatory Visit | Attending: Interventional Radiology | Admitting: Interventional Radiology

## 2020-05-25 DIAGNOSIS — I6523 Occlusion and stenosis of bilateral carotid arteries: Secondary | ICD-10-CM | POA: Diagnosis not present

## 2020-05-25 DIAGNOSIS — I671 Cerebral aneurysm, nonruptured: Secondary | ICD-10-CM | POA: Insufficient documentation

## 2020-05-25 DIAGNOSIS — I6502 Occlusion and stenosis of left vertebral artery: Secondary | ICD-10-CM | POA: Diagnosis not present

## 2020-05-25 LAB — POCT I-STAT CREATININE: Creatinine, Ser: 0.7 mg/dL (ref 0.44–1.00)

## 2020-05-25 MED ORDER — IOHEXOL 350 MG/ML SOLN
75.0000 mL | Freq: Once | INTRAVENOUS | Status: AC | PRN
  Administered 2020-05-25: 75 mL via INTRAVENOUS

## 2020-05-29 ENCOUNTER — Telehealth (HOSPITAL_COMMUNITY): Payer: Self-pay

## 2020-05-29 NOTE — Telephone Encounter (Signed)
Called regarding recent imaging, no answer, left vm. AW  

## 2020-05-29 NOTE — Telephone Encounter (Signed)
Pt agreed to f/u in 6 months with cta head/neck. AW 

## 2020-06-08 ENCOUNTER — Ambulatory Visit: Payer: Medicare Other

## 2020-06-15 DIAGNOSIS — H401131 Primary open-angle glaucoma, bilateral, mild stage: Secondary | ICD-10-CM | POA: Diagnosis not present

## 2020-07-25 DIAGNOSIS — I10 Essential (primary) hypertension: Secondary | ICD-10-CM | POA: Diagnosis not present

## 2020-07-25 DIAGNOSIS — I671 Cerebral aneurysm, nonruptured: Secondary | ICD-10-CM | POA: Diagnosis not present

## 2020-07-25 DIAGNOSIS — E785 Hyperlipidemia, unspecified: Secondary | ICD-10-CM | POA: Diagnosis not present

## 2020-08-03 DIAGNOSIS — K635 Polyp of colon: Secondary | ICD-10-CM | POA: Diagnosis not present

## 2020-08-03 DIAGNOSIS — Z1211 Encounter for screening for malignant neoplasm of colon: Secondary | ICD-10-CM | POA: Diagnosis not present

## 2020-08-03 DIAGNOSIS — D122 Benign neoplasm of ascending colon: Secondary | ICD-10-CM | POA: Diagnosis not present

## 2020-08-03 DIAGNOSIS — K5730 Diverticulosis of large intestine without perforation or abscess without bleeding: Secondary | ICD-10-CM | POA: Diagnosis not present

## 2020-08-07 DIAGNOSIS — D122 Benign neoplasm of ascending colon: Secondary | ICD-10-CM | POA: Diagnosis not present

## 2020-08-07 DIAGNOSIS — K635 Polyp of colon: Secondary | ICD-10-CM | POA: Diagnosis not present

## 2020-08-22 ENCOUNTER — Other Ambulatory Visit: Payer: Self-pay | Admitting: Gastroenterology

## 2020-08-22 ENCOUNTER — Ambulatory Visit
Admission: RE | Admit: 2020-08-22 | Discharge: 2020-08-22 | Disposition: A | Discharge status: Home / Self Care | Payer: Medicare Other | Source: Ambulatory Visit | Attending: Gastroenterology | Admitting: Gastroenterology

## 2020-08-22 DIAGNOSIS — R1030 Lower abdominal pain, unspecified: Secondary | ICD-10-CM

## 2020-08-22 DIAGNOSIS — R1032 Left lower quadrant pain: Secondary | ICD-10-CM | POA: Diagnosis not present

## 2020-08-22 DIAGNOSIS — K5900 Constipation, unspecified: Secondary | ICD-10-CM

## 2020-08-22 DIAGNOSIS — R109 Unspecified abdominal pain: Secondary | ICD-10-CM | POA: Diagnosis not present

## 2020-08-23 DIAGNOSIS — Z03818 Encounter for observation for suspected exposure to other biological agents ruled out: Secondary | ICD-10-CM | POA: Diagnosis not present

## 2020-08-24 DIAGNOSIS — M79641 Pain in right hand: Secondary | ICD-10-CM | POA: Diagnosis not present

## 2020-08-24 DIAGNOSIS — M79674 Pain in right toe(s): Secondary | ICD-10-CM | POA: Diagnosis not present

## 2020-08-24 DIAGNOSIS — R1032 Left lower quadrant pain: Secondary | ICD-10-CM | POA: Diagnosis not present

## 2020-08-24 DIAGNOSIS — K5900 Constipation, unspecified: Secondary | ICD-10-CM | POA: Diagnosis not present

## 2020-09-28 DIAGNOSIS — Z20822 Contact with and (suspected) exposure to covid-19: Secondary | ICD-10-CM | POA: Diagnosis not present

## 2020-09-30 DIAGNOSIS — U071 COVID-19: Secondary | ICD-10-CM | POA: Diagnosis not present

## 2020-10-06 DIAGNOSIS — U071 COVID-19: Secondary | ICD-10-CM | POA: Diagnosis not present

## 2020-10-06 DIAGNOSIS — Z03818 Encounter for observation for suspected exposure to other biological agents ruled out: Secondary | ICD-10-CM | POA: Diagnosis not present

## 2020-10-12 DIAGNOSIS — Z03818 Encounter for observation for suspected exposure to other biological agents ruled out: Secondary | ICD-10-CM | POA: Diagnosis not present

## 2020-11-02 DIAGNOSIS — Z03818 Encounter for observation for suspected exposure to other biological agents ruled out: Secondary | ICD-10-CM | POA: Diagnosis not present

## 2020-11-30 DIAGNOSIS — H401131 Primary open-angle glaucoma, bilateral, mild stage: Secondary | ICD-10-CM | POA: Diagnosis not present

## 2020-12-26 ENCOUNTER — Other Ambulatory Visit (HOSPITAL_COMMUNITY): Payer: Self-pay | Admitting: Interventional Radiology

## 2020-12-26 DIAGNOSIS — I671 Cerebral aneurysm, nonruptured: Secondary | ICD-10-CM

## 2021-01-22 ENCOUNTER — Other Ambulatory Visit (HOSPITAL_COMMUNITY): Payer: Medicare Other

## 2021-02-22 DIAGNOSIS — R202 Paresthesia of skin: Secondary | ICD-10-CM | POA: Diagnosis not present

## 2021-02-22 DIAGNOSIS — M8588 Other specified disorders of bone density and structure, other site: Secondary | ICD-10-CM | POA: Diagnosis not present

## 2021-02-22 DIAGNOSIS — I10 Essential (primary) hypertension: Secondary | ICD-10-CM | POA: Diagnosis not present

## 2021-02-22 DIAGNOSIS — I671 Cerebral aneurysm, nonruptured: Secondary | ICD-10-CM | POA: Diagnosis not present

## 2021-02-22 DIAGNOSIS — K219 Gastro-esophageal reflux disease without esophagitis: Secondary | ICD-10-CM | POA: Diagnosis not present

## 2021-02-22 DIAGNOSIS — K5900 Constipation, unspecified: Secondary | ICD-10-CM | POA: Diagnosis not present

## 2021-02-22 DIAGNOSIS — E785 Hyperlipidemia, unspecified: Secondary | ICD-10-CM | POA: Diagnosis not present

## 2021-02-22 DIAGNOSIS — Z0000 Encounter for general adult medical examination without abnormal findings: Secondary | ICD-10-CM | POA: Diagnosis not present

## 2021-02-26 ENCOUNTER — Telehealth (HOSPITAL_COMMUNITY): Payer: Self-pay

## 2021-03-08 ENCOUNTER — Other Ambulatory Visit: Payer: Self-pay | Admitting: Family Medicine

## 2021-03-08 DIAGNOSIS — M8580 Other specified disorders of bone density and structure, unspecified site: Secondary | ICD-10-CM

## 2021-03-15 ENCOUNTER — Ambulatory Visit (HOSPITAL_COMMUNITY)
Admission: RE | Admit: 2021-03-15 | Discharge: 2021-03-15 | Disposition: A | Discharge status: Home / Self Care | Payer: Medicare Other | Source: Ambulatory Visit | Attending: Interventional Radiology | Admitting: Interventional Radiology

## 2021-03-15 ENCOUNTER — Encounter (HOSPITAL_COMMUNITY): Payer: Self-pay

## 2021-03-15 ENCOUNTER — Other Ambulatory Visit: Payer: Self-pay

## 2021-03-15 DIAGNOSIS — I671 Cerebral aneurysm, nonruptured: Secondary | ICD-10-CM

## 2021-03-15 MED ORDER — SODIUM CHLORIDE (PF) 0.9 % IJ SOLN
INTRAMUSCULAR | Status: AC
  Filled 2021-03-15: qty 50

## 2021-03-15 MED ORDER — IOHEXOL 350 MG/ML SOLN: 80.0000 mL | Freq: Once | INTRAVENOUS | Status: DC | PRN

## 2021-04-11 ENCOUNTER — Other Ambulatory Visit (HOSPITAL_COMMUNITY): Payer: Self-pay | Admitting: Family Medicine

## 2021-04-12 ENCOUNTER — Ambulatory Visit (HOSPITAL_COMMUNITY)
Admission: RE | Admit: 2021-04-12 | Discharge: 2021-04-12 | Disposition: A | Discharge status: Home / Self Care | Payer: Medicare Other | Source: Ambulatory Visit | Attending: Interventional Radiology | Admitting: Interventional Radiology

## 2021-04-12 ENCOUNTER — Other Ambulatory Visit: Payer: Self-pay

## 2021-04-12 DIAGNOSIS — I671 Cerebral aneurysm, nonruptured: Secondary | ICD-10-CM | POA: Diagnosis not present

## 2021-04-12 DIAGNOSIS — I720 Aneurysm of carotid artery: Secondary | ICD-10-CM | POA: Diagnosis not present

## 2021-04-12 DIAGNOSIS — I672 Cerebral atherosclerosis: Secondary | ICD-10-CM | POA: Diagnosis not present

## 2021-04-12 DIAGNOSIS — I6523 Occlusion and stenosis of bilateral carotid arteries: Secondary | ICD-10-CM | POA: Diagnosis not present

## 2021-04-12 DIAGNOSIS — I639 Cerebral infarction, unspecified: Secondary | ICD-10-CM | POA: Diagnosis not present

## 2021-04-12 LAB — POCT I-STAT CREATININE: Creatinine, Ser: 0.8 mg/dL (ref 0.44–1.00)

## 2021-04-12 MED ORDER — IOHEXOL 350 MG/ML SOLN
75.0000 mL | Freq: Once | INTRAVENOUS | Status: AC | PRN
  Administered 2021-04-12: 75 mL via INTRAVENOUS

## 2021-04-12 MED ORDER — SODIUM CHLORIDE (PF) 0.9 % IJ SOLN
INTRAMUSCULAR | Status: AC
  Filled 2021-04-12: qty 50

## 2021-04-15 ENCOUNTER — Telehealth (HOSPITAL_COMMUNITY): Payer: Self-pay

## 2021-04-15 NOTE — Telephone Encounter (Signed)
Pt agreed to f/u in 1 year with a cta. AW  

## 2021-04-22 ENCOUNTER — Other Ambulatory Visit: Payer: Self-pay | Admitting: Family Medicine

## 2021-04-22 DIAGNOSIS — Z1231 Encounter for screening mammogram for malignant neoplasm of breast: Secondary | ICD-10-CM

## 2021-05-03 ENCOUNTER — Ambulatory Visit
Admission: RE | Admit: 2021-05-03 | Discharge: 2021-05-03 | Disposition: A | Discharge status: Home / Self Care | Payer: Medicare Other | Source: Ambulatory Visit | Attending: Family Medicine | Admitting: Family Medicine

## 2021-05-03 DIAGNOSIS — Z1231 Encounter for screening mammogram for malignant neoplasm of breast: Secondary | ICD-10-CM | POA: Diagnosis not present

## 2021-05-03 IMAGING — MG MM DIGITAL SCREENING BILAT W/ TOMO AND CAD
8 series · 9 of 24 positions shown · non-contrast
Comparison: Previous exam(s).

CLINICAL DATA: Screening.

EXAM:
DIGITAL SCREENING BILATERAL MAMMOGRAM WITH TOMOSYNTHESIS AND CAD
TECHNIQUE: Bilateral screening digital craniocaudal and mediolateral oblique
mammograms were obtained. Bilateral screening digital breast
tomosynthesis was performed. The images were evaluated with
computer-aided detection.

[R MLO synth-2D]
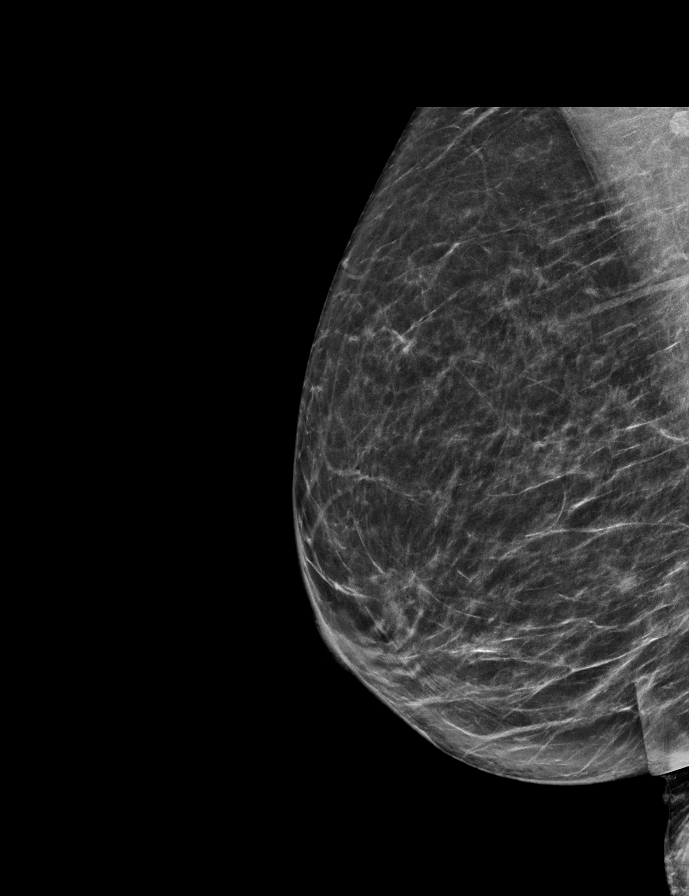

[R CC synth-2D]
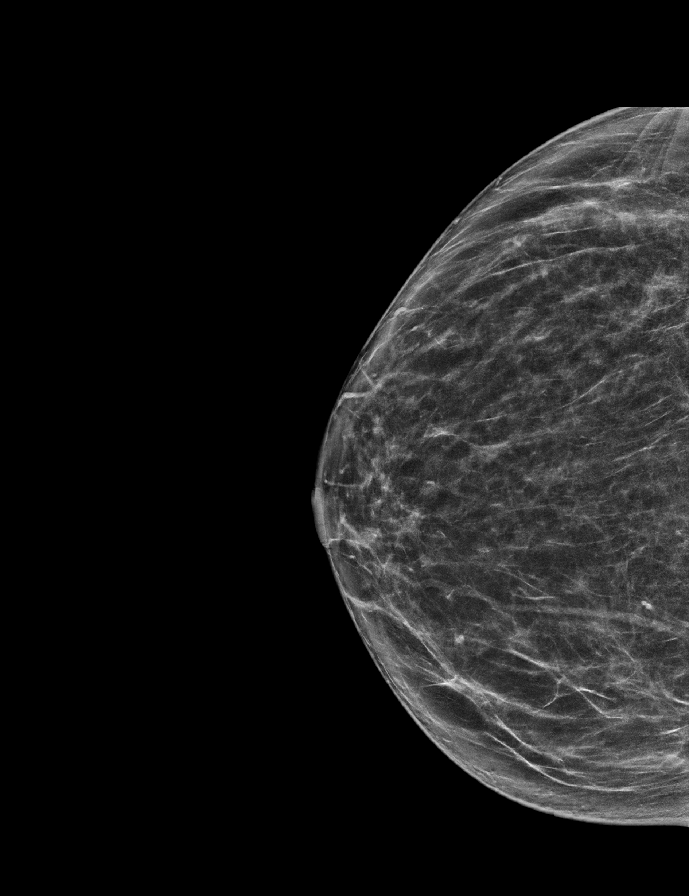

[L MLO synth-2D]
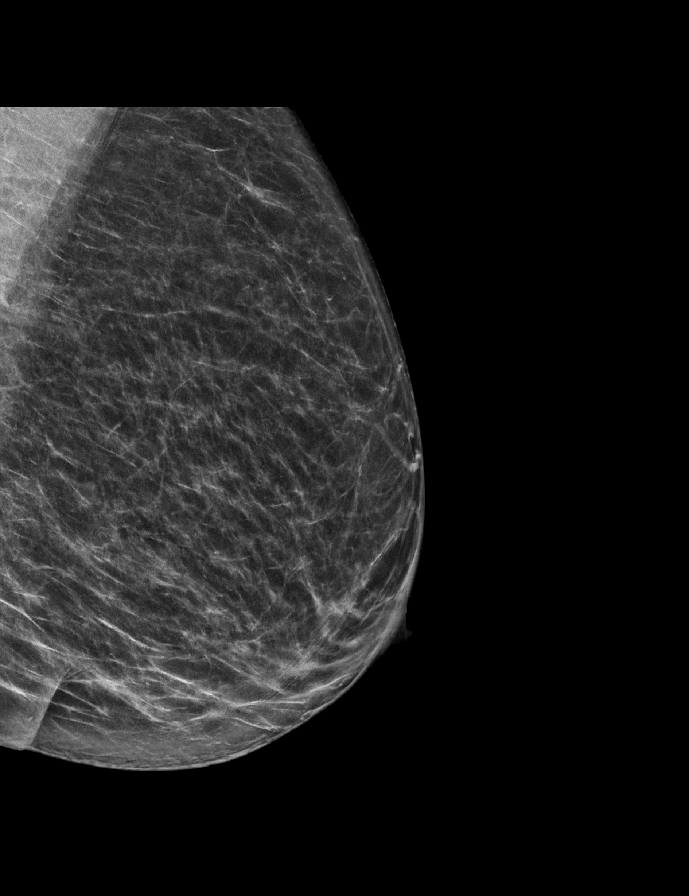

[L CC synth-2D]
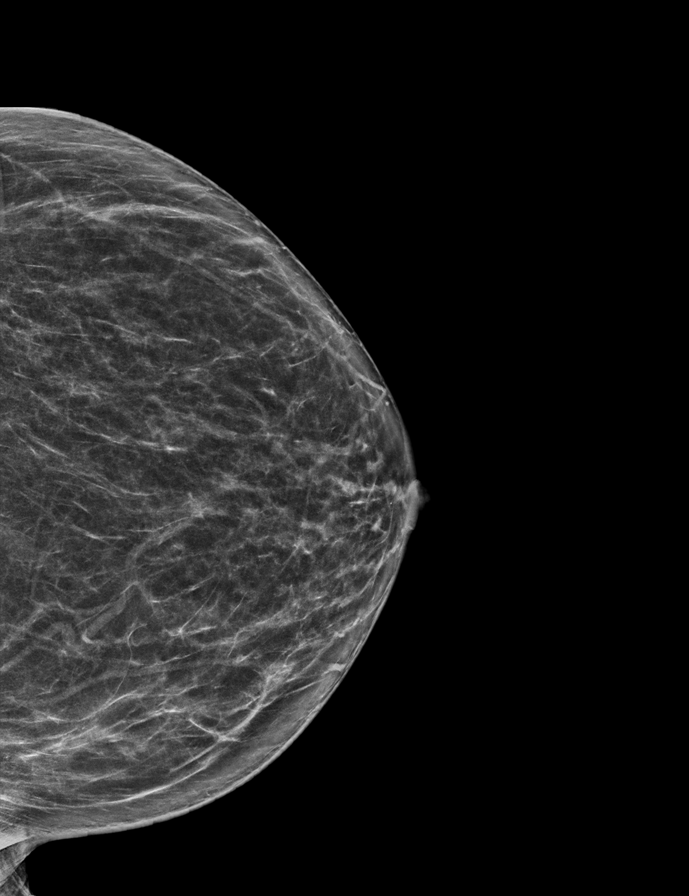

[R CC tomo · 2 of 67 frames shown]
[frame 22/67]
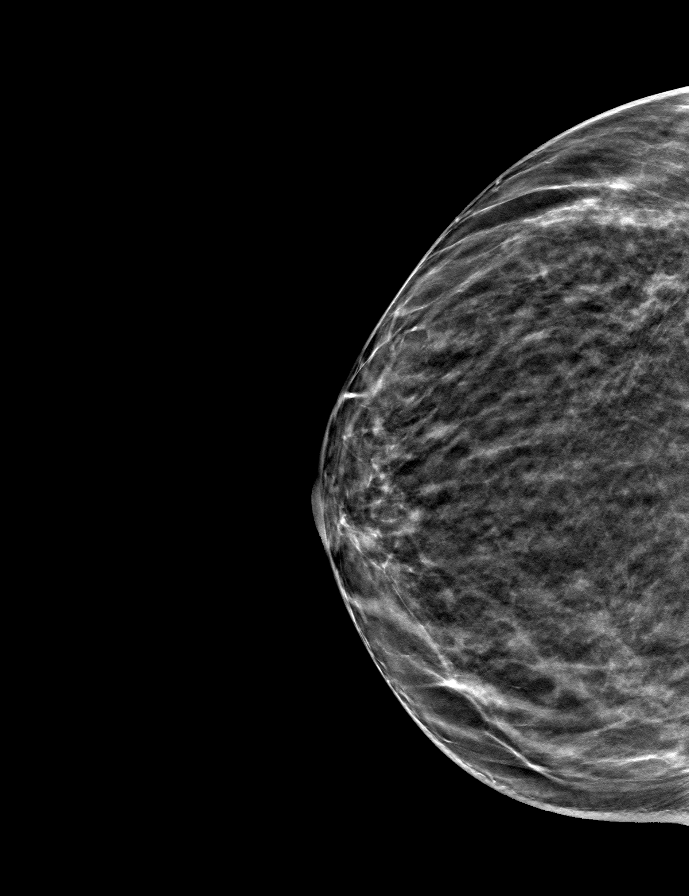
[frame 34/67]
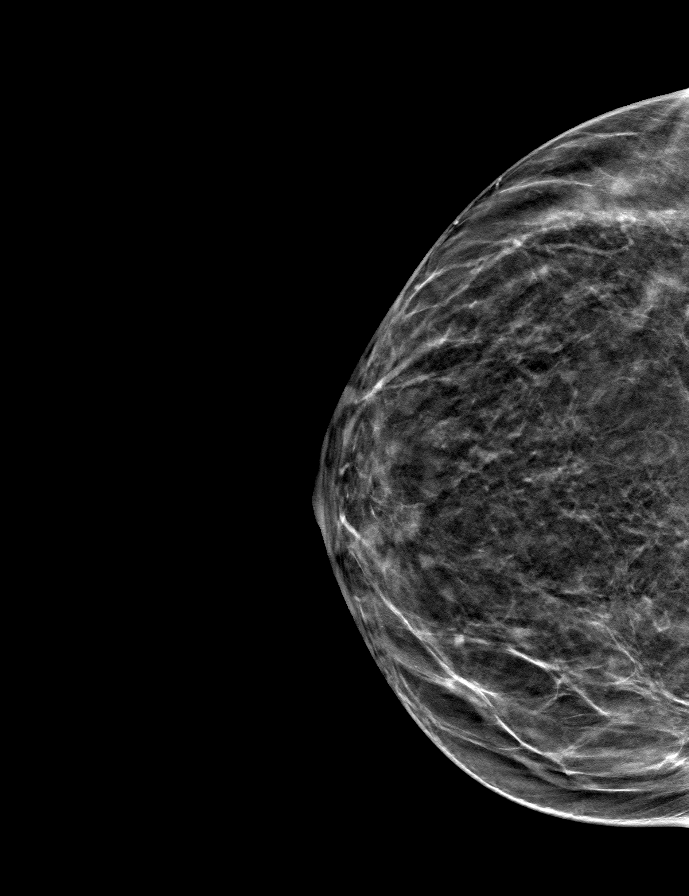

[R MLO tomo · tomo slice 35/68.0]
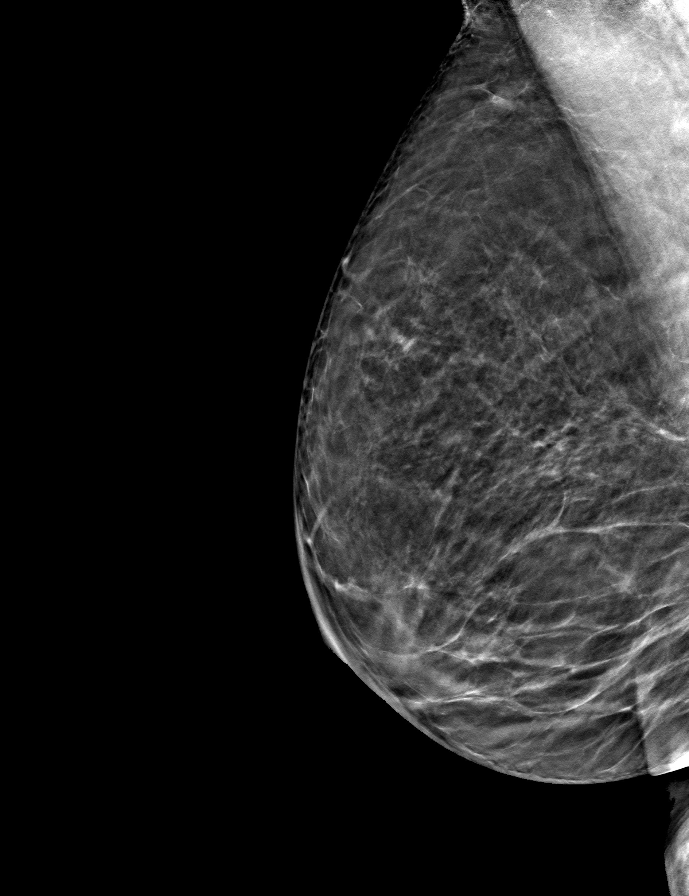

[L MLO tomo · tomo slice 35/68.0]
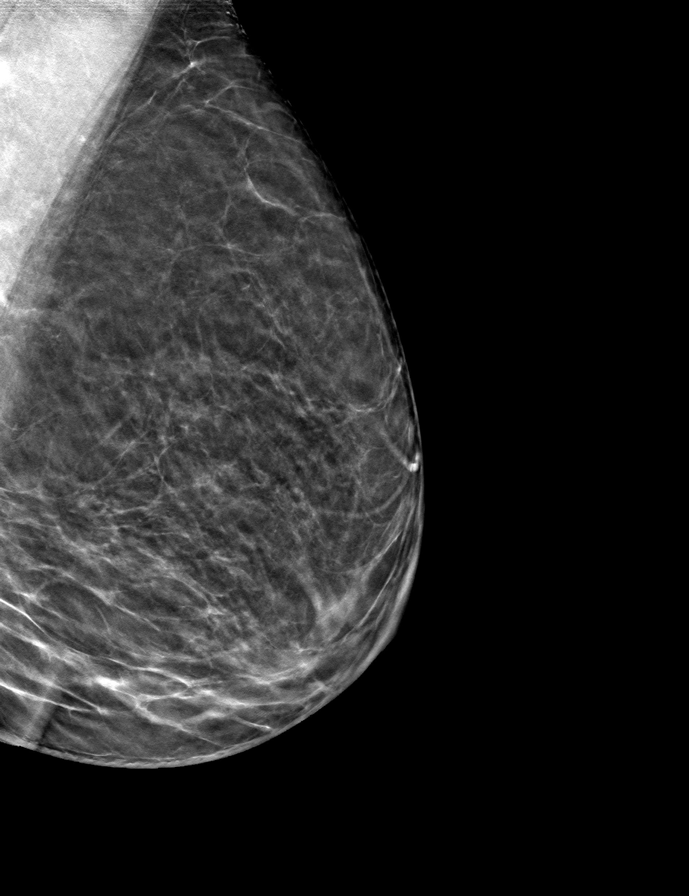

[L CC tomo · tomo slice 33/64.0]
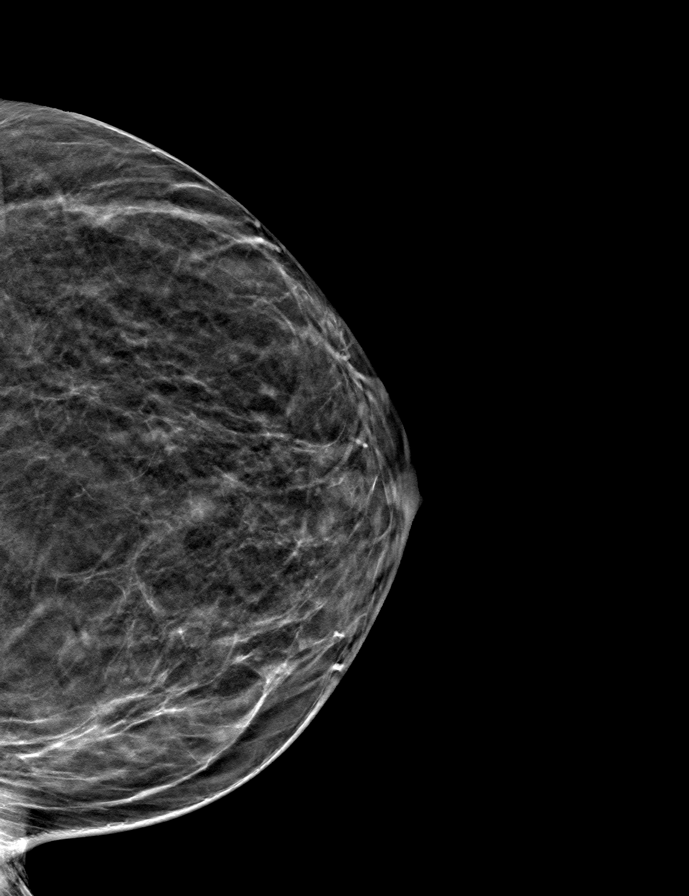

[9 of 24 positions shown; findings below may reference images not displayed]

ACR Breast Density Category b: There are scattered areas of
fibroglandular density.
FINDINGS: There are no findings suspicious for malignancy.
IMPRESSION: No mammographic evidence of malignancy. A result letter of this
screening mammogram will be mailed directly to the patient.

RECOMMENDATION:
Screening mammogram in one year. (Code:[BY])

BI-RADS CATEGORY  1: Negative.

## 2021-05-31 DIAGNOSIS — H401131 Primary open-angle glaucoma, bilateral, mild stage: Secondary | ICD-10-CM | POA: Diagnosis not present

## 2021-07-26 ENCOUNTER — Ambulatory Visit
Admission: RE | Admit: 2021-07-26 | Discharge: 2021-07-26 | Disposition: A | Discharge status: Home / Self Care | Payer: Medicare Other | Source: Ambulatory Visit | Attending: Family Medicine | Admitting: Family Medicine

## 2021-07-26 DIAGNOSIS — Z780 Asymptomatic menopausal state: Secondary | ICD-10-CM | POA: Diagnosis not present

## 2021-07-26 DIAGNOSIS — M85851 Other specified disorders of bone density and structure, right thigh: Secondary | ICD-10-CM | POA: Diagnosis not present

## 2021-07-26 DIAGNOSIS — M810 Age-related osteoporosis without current pathological fracture: Secondary | ICD-10-CM | POA: Diagnosis not present

## 2021-07-26 DIAGNOSIS — M8580 Other specified disorders of bone density and structure, unspecified site: Secondary | ICD-10-CM

## 2021-09-13 DIAGNOSIS — K219 Gastro-esophageal reflux disease without esophagitis: Secondary | ICD-10-CM | POA: Diagnosis not present

## 2021-09-13 DIAGNOSIS — E785 Hyperlipidemia, unspecified: Secondary | ICD-10-CM | POA: Diagnosis not present

## 2021-09-13 DIAGNOSIS — I10 Essential (primary) hypertension: Secondary | ICD-10-CM | POA: Diagnosis not present

## 2021-12-12 DIAGNOSIS — H401131 Primary open-angle glaucoma, bilateral, mild stage: Secondary | ICD-10-CM | POA: Diagnosis not present

## 2022-03-14 DIAGNOSIS — E559 Vitamin D deficiency, unspecified: Secondary | ICD-10-CM | POA: Diagnosis not present

## 2022-03-14 DIAGNOSIS — Z23 Encounter for immunization: Secondary | ICD-10-CM | POA: Diagnosis not present

## 2022-03-14 DIAGNOSIS — Z0000 Encounter for general adult medical examination without abnormal findings: Secondary | ICD-10-CM | POA: Diagnosis not present

## 2022-03-14 DIAGNOSIS — M545 Low back pain, unspecified: Secondary | ICD-10-CM | POA: Diagnosis not present

## 2022-03-14 DIAGNOSIS — K219 Gastro-esophageal reflux disease without esophagitis: Secondary | ICD-10-CM | POA: Diagnosis not present

## 2022-03-14 DIAGNOSIS — I10 Essential (primary) hypertension: Secondary | ICD-10-CM | POA: Diagnosis not present

## 2022-03-14 DIAGNOSIS — K5900 Constipation, unspecified: Secondary | ICD-10-CM | POA: Diagnosis not present

## 2022-03-14 DIAGNOSIS — I499 Cardiac arrhythmia, unspecified: Secondary | ICD-10-CM | POA: Diagnosis not present

## 2022-03-14 DIAGNOSIS — E785 Hyperlipidemia, unspecified: Secondary | ICD-10-CM | POA: Diagnosis not present

## 2022-03-14 DIAGNOSIS — E739 Lactose intolerance, unspecified: Secondary | ICD-10-CM | POA: Diagnosis not present

## 2022-03-14 DIAGNOSIS — M8588 Other specified disorders of bone density and structure, other site: Secondary | ICD-10-CM | POA: Diagnosis not present

## 2022-04-17 ENCOUNTER — Other Ambulatory Visit: Payer: Self-pay | Admitting: Family Medicine

## 2022-04-17 DIAGNOSIS — Z1231 Encounter for screening mammogram for malignant neoplasm of breast: Secondary | ICD-10-CM

## 2022-06-06 ENCOUNTER — Ambulatory Visit
Admission: RE | Admit: 2022-06-06 | Discharge: 2022-06-06 | Disposition: A | Discharge status: Home / Self Care | Payer: Medicare Other | Source: Ambulatory Visit | Attending: Family Medicine | Admitting: Family Medicine

## 2022-06-06 DIAGNOSIS — Z1231 Encounter for screening mammogram for malignant neoplasm of breast: Secondary | ICD-10-CM | POA: Diagnosis not present

## 2022-06-10 ENCOUNTER — Other Ambulatory Visit: Payer: Self-pay | Admitting: Family Medicine

## 2022-06-10 DIAGNOSIS — R928 Other abnormal and inconclusive findings on diagnostic imaging of breast: Secondary | ICD-10-CM

## 2022-06-13 DIAGNOSIS — R300 Dysuria: Secondary | ICD-10-CM | POA: Diagnosis not present

## 2022-06-16 DIAGNOSIS — H401131 Primary open-angle glaucoma, bilateral, mild stage: Secondary | ICD-10-CM | POA: Diagnosis not present

## 2022-06-20 ENCOUNTER — Ambulatory Visit
Admission: RE | Admit: 2022-06-20 | Discharge: 2022-06-20 | Disposition: A | Discharge status: Home / Self Care | Payer: Medicare Other | Source: Ambulatory Visit | Attending: Family Medicine | Admitting: Family Medicine

## 2022-06-20 DIAGNOSIS — R928 Other abnormal and inconclusive findings on diagnostic imaging of breast: Secondary | ICD-10-CM

## 2022-06-20 DIAGNOSIS — R922 Inconclusive mammogram: Secondary | ICD-10-CM | POA: Diagnosis not present

## 2022-09-12 DIAGNOSIS — E785 Hyperlipidemia, unspecified: Secondary | ICD-10-CM | POA: Diagnosis not present

## 2022-09-12 DIAGNOSIS — I10 Essential (primary) hypertension: Secondary | ICD-10-CM | POA: Diagnosis not present

## 2022-10-15 DIAGNOSIS — U071 COVID-19: Secondary | ICD-10-CM | POA: Diagnosis not present

## 2022-11-20 DIAGNOSIS — R300 Dysuria: Secondary | ICD-10-CM | POA: Diagnosis not present

## 2022-11-20 DIAGNOSIS — N3090 Cystitis, unspecified without hematuria: Secondary | ICD-10-CM | POA: Diagnosis not present

## 2022-12-08 DIAGNOSIS — H401131 Primary open-angle glaucoma, bilateral, mild stage: Secondary | ICD-10-CM | POA: Diagnosis not present

## 2023-03-06 DIAGNOSIS — M545 Low back pain, unspecified: Secondary | ICD-10-CM | POA: Diagnosis not present

## 2023-03-27 ENCOUNTER — Other Ambulatory Visit: Payer: Self-pay | Admitting: Family Medicine

## 2023-03-27 DIAGNOSIS — E785 Hyperlipidemia, unspecified: Secondary | ICD-10-CM | POA: Diagnosis not present

## 2023-03-27 DIAGNOSIS — Z0000 Encounter for general adult medical examination without abnormal findings: Secondary | ICD-10-CM | POA: Diagnosis not present

## 2023-03-27 DIAGNOSIS — I10 Essential (primary) hypertension: Secondary | ICD-10-CM | POA: Diagnosis not present

## 2023-03-27 DIAGNOSIS — E559 Vitamin D deficiency, unspecified: Secondary | ICD-10-CM | POA: Diagnosis not present

## 2023-03-27 DIAGNOSIS — I671 Cerebral aneurysm, nonruptured: Secondary | ICD-10-CM | POA: Diagnosis not present

## 2023-03-27 DIAGNOSIS — M8588 Other specified disorders of bone density and structure, other site: Secondary | ICD-10-CM | POA: Diagnosis not present

## 2023-03-27 DIAGNOSIS — K219 Gastro-esophageal reflux disease without esophagitis: Secondary | ICD-10-CM | POA: Diagnosis not present

## 2023-03-27 DIAGNOSIS — M8580 Other specified disorders of bone density and structure, unspecified site: Secondary | ICD-10-CM

## 2023-04-02 ENCOUNTER — Other Ambulatory Visit (HOSPITAL_COMMUNITY): Payer: Self-pay | Admitting: Interventional Radiology

## 2023-04-02 DIAGNOSIS — I671 Cerebral aneurysm, nonruptured: Secondary | ICD-10-CM

## 2023-04-03 DIAGNOSIS — E785 Hyperlipidemia, unspecified: Secondary | ICD-10-CM | POA: Diagnosis not present

## 2023-04-03 DIAGNOSIS — E559 Vitamin D deficiency, unspecified: Secondary | ICD-10-CM | POA: Diagnosis not present

## 2023-04-03 DIAGNOSIS — I10 Essential (primary) hypertension: Secondary | ICD-10-CM | POA: Diagnosis not present

## 2023-04-17 ENCOUNTER — Ambulatory Visit (HOSPITAL_COMMUNITY)
Admission: RE | Admit: 2023-04-17 | Discharge: 2023-04-17 | Disposition: A | Discharge status: Home / Self Care | Payer: Medicare Other | Source: Ambulatory Visit | Attending: Interventional Radiology | Admitting: Interventional Radiology

## 2023-04-17 DIAGNOSIS — I6523 Occlusion and stenosis of bilateral carotid arteries: Secondary | ICD-10-CM | POA: Diagnosis not present

## 2023-04-17 DIAGNOSIS — I671 Cerebral aneurysm, nonruptured: Secondary | ICD-10-CM | POA: Diagnosis not present

## 2023-04-17 MED ORDER — IOHEXOL 350 MG/ML SOLN
75.0000 mL | Freq: Once | INTRAVENOUS | Status: AC | PRN
  Administered 2023-04-17: 75 mL via INTRAVENOUS

## 2023-04-21 ENCOUNTER — Telehealth (HOSPITAL_COMMUNITY): Payer: Self-pay

## 2023-04-21 NOTE — Telephone Encounter (Signed)
Pt agreed to f/u in 2 years with a cta head/neck. AB

## 2023-06-08 DIAGNOSIS — H401131 Primary open-angle glaucoma, bilateral, mild stage: Secondary | ICD-10-CM | POA: Diagnosis not present

## 2023-06-15 ENCOUNTER — Other Ambulatory Visit: Payer: Self-pay | Admitting: Family Medicine

## 2023-06-15 DIAGNOSIS — Z1231 Encounter for screening mammogram for malignant neoplasm of breast: Secondary | ICD-10-CM

## 2023-06-23 ENCOUNTER — Ambulatory Visit
Admission: RE | Admit: 2023-06-23 | Discharge: 2023-06-23 | Disposition: A | Discharge status: Home / Self Care | Source: Ambulatory Visit | Attending: Family Medicine | Admitting: Family Medicine

## 2023-06-23 DIAGNOSIS — Z1231 Encounter for screening mammogram for malignant neoplasm of breast: Secondary | ICD-10-CM | POA: Diagnosis not present

## 2023-08-15 ENCOUNTER — Encounter (HOSPITAL_COMMUNITY): Payer: Self-pay | Admitting: Interventional Radiology

## 2023-08-26 DIAGNOSIS — R109 Unspecified abdominal pain: Secondary | ICD-10-CM | POA: Diagnosis not present

## 2023-08-26 DIAGNOSIS — R197 Diarrhea, unspecified: Secondary | ICD-10-CM | POA: Diagnosis not present

## 2023-08-26 DIAGNOSIS — K921 Melena: Secondary | ICD-10-CM | POA: Diagnosis not present

## 2023-08-28 DIAGNOSIS — R197 Diarrhea, unspecified: Secondary | ICD-10-CM | POA: Diagnosis not present

## 2023-08-28 DIAGNOSIS — R109 Unspecified abdominal pain: Secondary | ICD-10-CM | POA: Diagnosis not present

## 2023-09-17 DIAGNOSIS — I10 Essential (primary) hypertension: Secondary | ICD-10-CM | POA: Diagnosis not present

## 2023-09-17 DIAGNOSIS — E785 Hyperlipidemia, unspecified: Secondary | ICD-10-CM | POA: Diagnosis not present

## 2023-10-09 DIAGNOSIS — I10 Essential (primary) hypertension: Secondary | ICD-10-CM | POA: Diagnosis not present

## 2023-10-09 DIAGNOSIS — E785 Hyperlipidemia, unspecified: Secondary | ICD-10-CM | POA: Diagnosis not present

## 2023-10-18 DIAGNOSIS — I10 Essential (primary) hypertension: Secondary | ICD-10-CM | POA: Diagnosis not present

## 2023-10-18 DIAGNOSIS — E785 Hyperlipidemia, unspecified: Secondary | ICD-10-CM | POA: Diagnosis not present

## 2023-11-17 DIAGNOSIS — I10 Essential (primary) hypertension: Secondary | ICD-10-CM | POA: Diagnosis not present

## 2023-11-17 DIAGNOSIS — E785 Hyperlipidemia, unspecified: Secondary | ICD-10-CM | POA: Diagnosis not present

## 2023-11-26 ENCOUNTER — Other Ambulatory Visit (HOSPITAL_BASED_OUTPATIENT_CLINIC_OR_DEPARTMENT_OTHER)

## 2023-11-27 ENCOUNTER — Other Ambulatory Visit: Payer: Medicare Other

## 2023-12-15 ENCOUNTER — Ambulatory Visit (HOSPITAL_BASED_OUTPATIENT_CLINIC_OR_DEPARTMENT_OTHER)
Admission: RE | Admit: 2023-12-15 | Discharge: 2023-12-15 | Disposition: A | Discharge status: Home / Self Care | Source: Ambulatory Visit | Attending: Family Medicine | Admitting: Family Medicine

## 2023-12-15 DIAGNOSIS — M810 Age-related osteoporosis without current pathological fracture: Secondary | ICD-10-CM | POA: Insufficient documentation

## 2023-12-15 DIAGNOSIS — M8580 Other specified disorders of bone density and structure, unspecified site: Secondary | ICD-10-CM | POA: Diagnosis present

## 2023-12-15 DIAGNOSIS — Z780 Asymptomatic menopausal state: Secondary | ICD-10-CM | POA: Diagnosis not present
